# Patient Record
Sex: Male | Born: 1960 | Race: White | Hispanic: No | Marital: Married | Smoking: Never smoker
Health system: Southern US, Community
[De-identification: ages and names within clinical notes are randomized; demographics above are authoritative.]

## PROBLEM LIST (undated history)

## (undated) DIAGNOSIS — M1990 Unspecified osteoarthritis, unspecified site: Secondary | ICD-10-CM

## (undated) DIAGNOSIS — N189 Chronic kidney disease, unspecified: Secondary | ICD-10-CM

## (undated) DIAGNOSIS — J45909 Unspecified asthma, uncomplicated: Secondary | ICD-10-CM

## (undated) DIAGNOSIS — M109 Gout, unspecified: Secondary | ICD-10-CM

## (undated) DIAGNOSIS — I10 Essential (primary) hypertension: Secondary | ICD-10-CM

## (undated) DIAGNOSIS — I429 Cardiomyopathy, unspecified: Secondary | ICD-10-CM

## (undated) DIAGNOSIS — I471 Supraventricular tachycardia, unspecified: Secondary | ICD-10-CM

## (undated) HISTORY — PX: COLONOSCOPY: SHX174

## (undated) HISTORY — PX: TOOTH EXTRACTION: SUR596

---

## 2014-04-17 ENCOUNTER — Encounter (HOSPITAL_COMMUNITY): Payer: Self-pay | Admitting: Pharmacy Technician

## 2014-04-17 ENCOUNTER — Encounter (HOSPITAL_COMMUNITY)
Admission: AD | Disposition: A | Discharge status: Home Health | Payer: Self-pay | Source: Ambulatory Visit | Attending: Orthopedic Surgery

## 2014-04-17 ENCOUNTER — Inpatient Hospital Stay (HOSPITAL_COMMUNITY)
Admission: AD | Admit: 2014-04-17 | Discharge: 2014-04-18 | DRG: 488 | Disposition: A | Discharge status: Home Health | Payer: PRIVATE HEALTH INSURANCE | Source: Ambulatory Visit | Attending: Orthopedic Surgery | Admitting: Orthopedic Surgery

## 2014-04-17 ENCOUNTER — Ambulatory Visit (HOSPITAL_COMMUNITY): Payer: PRIVATE HEALTH INSURANCE

## 2014-04-17 ENCOUNTER — Encounter (HOSPITAL_COMMUNITY): Payer: PRIVATE HEALTH INSURANCE | Admitting: Anesthesiology

## 2014-04-17 ENCOUNTER — Ambulatory Visit (HOSPITAL_COMMUNITY): Payer: PRIVATE HEALTH INSURANCE | Admitting: Anesthesiology

## 2014-04-17 ENCOUNTER — Encounter (HOSPITAL_COMMUNITY): Payer: Self-pay | Admitting: *Deleted

## 2014-04-17 DIAGNOSIS — IMO0002 Reserved for concepts with insufficient information to code with codable children: Secondary | ICD-10-CM | POA: Diagnosis present

## 2014-04-17 DIAGNOSIS — Z6832 Body mass index (BMI) 32.0-32.9, adult: Secondary | ICD-10-CM

## 2014-04-17 DIAGNOSIS — M25579 Pain in unspecified ankle and joints of unspecified foot: Secondary | ICD-10-CM | POA: Diagnosis present

## 2014-04-17 DIAGNOSIS — I129 Hypertensive chronic kidney disease with stage 1 through stage 4 chronic kidney disease, or unspecified chronic kidney disease: Secondary | ICD-10-CM | POA: Diagnosis present

## 2014-04-17 DIAGNOSIS — S83242A Other tear of medial meniscus, current injury, left knee, initial encounter: Secondary | ICD-10-CM | POA: Diagnosis present

## 2014-04-17 DIAGNOSIS — N183 Chronic kidney disease, stage 3 unspecified: Secondary | ICD-10-CM

## 2014-04-17 DIAGNOSIS — E669 Obesity, unspecified: Secondary | ICD-10-CM | POA: Diagnosis present

## 2014-04-17 DIAGNOSIS — M009 Pyogenic arthritis, unspecified: Secondary | ICD-10-CM | POA: Diagnosis present

## 2014-04-17 DIAGNOSIS — M109 Gout, unspecified: Principal | ICD-10-CM | POA: Diagnosis present

## 2014-04-17 DIAGNOSIS — X58XXXA Exposure to other specified factors, initial encounter: Secondary | ICD-10-CM | POA: Diagnosis present

## 2014-04-17 DIAGNOSIS — Z79899 Other long term (current) drug therapy: Secondary | ICD-10-CM

## 2014-04-17 DIAGNOSIS — J45909 Unspecified asthma, uncomplicated: Secondary | ICD-10-CM | POA: Diagnosis present

## 2014-04-17 DIAGNOSIS — Z880 Allergy status to penicillin: Secondary | ICD-10-CM

## 2014-04-17 DIAGNOSIS — M10062 Idiopathic gout, left knee: Secondary | ICD-10-CM | POA: Diagnosis present

## 2014-04-17 DIAGNOSIS — M9420 Chondromalacia, unspecified site: Secondary | ICD-10-CM | POA: Diagnosis present

## 2014-04-17 HISTORY — DX: Chronic kidney disease, unspecified: N18.9

## 2014-04-17 HISTORY — DX: Unspecified osteoarthritis, unspecified site: M19.90

## 2014-04-17 HISTORY — PX: KNEE ARTHROSCOPY WITH MEDIAL MENISECTOMY: SHX5651

## 2014-04-17 HISTORY — DX: Unspecified asthma, uncomplicated: J45.909

## 2014-04-17 HISTORY — PX: ASPIRATION BIOPSY: SHX1197

## 2014-04-17 HISTORY — PX: KNEE ARTHROSCOPY: SHX127

## 2014-04-17 HISTORY — DX: Essential (primary) hypertension: I10

## 2014-04-17 HISTORY — DX: Gout, unspecified: M10.9

## 2014-04-17 LAB — COMPREHENSIVE METABOLIC PANEL
ALT: 19 U/L (ref 0–53)
AST: 20 U/L (ref 0–37)
Albumin: 4 g/dL (ref 3.5–5.2)
Alkaline Phosphatase: 81 U/L (ref 39–117)
BUN: 23 mg/dL (ref 6–23)
CO2: 20 mEq/L (ref 19–32)
Calcium: 10.1 mg/dL (ref 8.4–10.5)
Chloride: 100 mEq/L (ref 96–112)
Creatinine, Ser: 1.29 mg/dL (ref 0.50–1.35)
GFR calc Af Amer: 72 mL/min — ABNORMAL LOW (ref >90)
GFR calc non Af Amer: 62 mL/min — ABNORMAL LOW (ref >90)
Glucose, Bld: 123 mg/dL — ABNORMAL HIGH (ref 70–99)
Potassium: 4.7 mEq/L (ref 3.7–5.3)
Sodium: 137 mEq/L (ref 137–147)
Total Bilirubin: 0.7 mg/dL (ref 0.3–1.2)
Total Protein: 8.3 g/dL (ref 6.0–8.3)

## 2014-04-17 LAB — CBC
HCT: 43.1 % (ref 39.0–52.0)
Hemoglobin: 14.8 g/dL (ref 13.0–17.0)
MCH: 29.4 pg (ref 26.0–34.0)
MCHC: 34.3 g/dL (ref 30.0–36.0)
MCV: 85.7 fL (ref 78.0–100.0)
Platelets: 245 10*3/uL (ref 150–400)
RBC: 5.03 MIL/uL (ref 4.22–5.81)
RDW: 14.1 % (ref 11.5–15.5)
WBC: 14.9 10*3/uL — ABNORMAL HIGH (ref 4.0–10.5)

## 2014-04-17 LAB — SYNOVIAL CELL COUNT + DIFF, W/ CRYSTALS
Eosinophils-Synovial: 0 % (ref 0–1)
Lymphocytes-Synovial Fld: 1 % (ref 0–20)
Monocyte-Macrophage-Synovial Fluid: 28 % — ABNORMAL LOW (ref 50–90)
Neutrophil, Synovial: 71 % — ABNORMAL HIGH (ref 0–25)
Other Cells-SYN: 0
WBC, Synovial: 61000 /mm3 — ABNORMAL HIGH (ref 0–200)

## 2014-04-17 LAB — GRAM STAIN

## 2014-04-17 LAB — URINALYSIS, ROUTINE W REFLEX MICROSCOPIC
Bilirubin Urine: NEGATIVE
Glucose, UA: NEGATIVE mg/dL
Hgb urine dipstick: NEGATIVE
Ketones, ur: NEGATIVE mg/dL
Leukocytes, UA: NEGATIVE
Nitrite: NEGATIVE
Protein, ur: NEGATIVE mg/dL
Specific Gravity, Urine: 1.024 (ref 1.005–1.030)
Urobilinogen, UA: 0.2 mg/dL (ref 0.0–1.0)
pH: 6 (ref 5.0–8.0)

## 2014-04-17 LAB — C-REACTIVE PROTEIN: CRP: 12.5 mg/dL — ABNORMAL HIGH (ref <0.60)

## 2014-04-17 LAB — PROTIME-INR
INR: 1.05 (ref 0.00–1.49)
Prothrombin Time: 13.5 seconds (ref 11.6–15.2)

## 2014-04-17 LAB — SEDIMENTATION RATE: Sed Rate: 24 mm/hr — ABNORMAL HIGH (ref 0–16)

## 2014-04-17 LAB — APTT: aPTT: 32 seconds (ref 24–37)

## 2014-04-17 SURGERY — ARTHROSCOPY, KNEE
Anesthesia: General | Site: Knee | Laterality: Left

## 2014-04-17 MED ORDER — HYDROMORPHONE HCL PF 1 MG/ML IJ SOLN: 1.0000 mg | INTRAMUSCULAR | Status: DC | PRN

## 2014-04-17 MED ORDER — VANCOMYCIN HCL IN DEXTROSE 1-5 GM/200ML-% IV SOLN
1000.0000 mg | Freq: Two times a day (BID) | INTRAVENOUS | Status: DC
  Administered 2014-04-17 – 2014-04-18 (×2): 1000 mg via INTRAVENOUS
  Filled 2014-04-17 (×4): qty 200

## 2014-04-17 MED ORDER — ROCURONIUM BROMIDE 50 MG/5ML IV SOLN
INTRAVENOUS | Status: AC
  Filled 2014-04-17: qty 1

## 2014-04-17 MED ORDER — LIDOCAINE HCL (CARDIAC) 20 MG/ML IV SOLN
INTRAVENOUS | Status: AC
  Filled 2014-04-17: qty 10

## 2014-04-17 MED ORDER — ASPIRIN EC 325 MG PO TBEC
325.0000 mg | DELAYED_RELEASE_TABLET | Freq: Every day | ORAL | Status: DC
  Administered 2014-04-18: 325 mg via ORAL
  Filled 2014-04-17: qty 1

## 2014-04-17 MED ORDER — PROPOFOL 10 MG/ML IV BOLUS
INTRAVENOUS | Status: AC
  Filled 2014-04-17: qty 20

## 2014-04-17 MED ORDER — VANCOMYCIN HCL IN DEXTROSE 1-5 GM/200ML-% IV SOLN
INTRAVENOUS | Status: AC
  Administered 2014-04-17: 1000 mg via INTRAVENOUS
  Filled 2014-04-17: qty 200

## 2014-04-17 MED ORDER — SODIUM CHLORIDE 0.9 % IV SOLN: INTRAVENOUS | Status: DC

## 2014-04-17 MED ORDER — OXYCODONE HCL 5 MG/5ML PO SOLN: 5.0000 mg | Freq: Once | ORAL | Status: AC | PRN

## 2014-04-17 MED ORDER — METHOCARBAMOL 1000 MG/10ML IJ SOLN
500.0000 mg | Freq: Four times a day (QID) | INTRAMUSCULAR | Status: DC | PRN
  Filled 2014-04-17: qty 5

## 2014-04-17 MED ORDER — ONDANSETRON HCL 4 MG/2ML IJ SOLN
INTRAMUSCULAR | Status: AC
  Filled 2014-04-17: qty 2

## 2014-04-17 MED ORDER — LIDOCAINE HCL (CARDIAC) 20 MG/ML IV SOLN
INTRAVENOUS | Status: DC | PRN
  Administered 2014-04-17: 100 mg via INTRAVENOUS

## 2014-04-17 MED ORDER — PROPOFOL 10 MG/ML IV BOLUS
INTRAVENOUS | Status: DC | PRN
  Administered 2014-04-17: 200 mg via INTRAVENOUS

## 2014-04-17 MED ORDER — MOMETASONE FURO-FORMOTEROL FUM 100-5 MCG/ACT IN AERO
2.0000 | INHALATION_SPRAY | Freq: Two times a day (BID) | RESPIRATORY_TRACT | Status: DC
  Filled 2014-04-17: qty 8.8

## 2014-04-17 MED ORDER — BENAZEPRIL HCL 10 MG PO TABS
10.0000 mg | ORAL_TABLET | Freq: Every day | ORAL | Status: DC
  Administered 2014-04-17 – 2014-04-18 (×2): 10 mg via ORAL
  Filled 2014-04-17 (×4): qty 1

## 2014-04-17 MED ORDER — MIDAZOLAM HCL 5 MG/5ML IJ SOLN
INTRAMUSCULAR | Status: DC | PRN
  Administered 2014-04-17: 2 mg via INTRAVENOUS

## 2014-04-17 MED ORDER — FENTANYL CITRATE 0.05 MG/ML IJ SOLN
INTRAMUSCULAR | Status: DC | PRN
  Administered 2014-04-17: 25 ug via INTRAVENOUS
  Administered 2014-04-17: 50 ug via INTRAVENOUS
  Administered 2014-04-17: 25 ug via INTRAVENOUS
  Administered 2014-04-17 (×3): 50 ug via INTRAVENOUS

## 2014-04-17 MED ORDER — OXYCODONE HCL 5 MG PO TABS
ORAL_TABLET | ORAL | Status: AC
  Filled 2014-04-17: qty 1

## 2014-04-17 MED ORDER — ONDANSETRON HCL 4 MG/2ML IJ SOLN
INTRAMUSCULAR | Status: DC | PRN
  Administered 2014-04-17: 4 mg via INTRAVENOUS

## 2014-04-17 MED ORDER — MEPERIDINE HCL 25 MG/ML IJ SOLN: 6.2500 mg | INTRAMUSCULAR | Status: DC | PRN

## 2014-04-17 MED ORDER — SODIUM CHLORIDE 0.9 % IR SOLN
Status: DC | PRN
  Administered 2014-04-17: 3000 mL

## 2014-04-17 MED ORDER — HYDROMORPHONE HCL PF 1 MG/ML IJ SOLN
INTRAMUSCULAR | Status: AC
  Filled 2014-04-17: qty 1

## 2014-04-17 MED ORDER — MONTELUKAST SODIUM 10 MG PO TABS
10.0000 mg | ORAL_TABLET | Freq: Every day | ORAL | Status: DC
  Administered 2014-04-17: 10 mg via ORAL
  Filled 2014-04-17 (×2): qty 1

## 2014-04-17 MED ORDER — HYDROMORPHONE HCL PF 1 MG/ML IJ SOLN
0.2500 mg | INTRAMUSCULAR | Status: DC | PRN
  Administered 2014-04-17 (×2): 0.5 mg via INTRAVENOUS

## 2014-04-17 MED ORDER — SODIUM CHLORIDE 0.9 % IV SOLN
INTRAVENOUS | Status: DC
  Administered 2014-04-17: 19:00:00 via INTRAVENOUS

## 2014-04-17 MED ORDER — MIDAZOLAM HCL 2 MG/2ML IJ SOLN
INTRAMUSCULAR | Status: AC
  Filled 2014-04-17: qty 2

## 2014-04-17 MED ORDER — BUPIVACAINE HCL (PF) 0.25 % IJ SOLN
INTRAMUSCULAR | Status: AC
  Filled 2014-04-17: qty 10

## 2014-04-17 MED ORDER — BUPIVACAINE HCL (PF) 0.25 % IJ SOLN
INTRAMUSCULAR | Status: DC | PRN
  Administered 2014-04-17: 10 mL

## 2014-04-17 MED ORDER — DEXTROSE 5 % IV SOLN
1.0000 g | INTRAVENOUS | Status: DC
  Administered 2014-04-17: 1 g via INTRAVENOUS
  Filled 2014-04-17 (×2): qty 10

## 2014-04-17 MED ORDER — NEOSTIGMINE METHYLSULFATE 10 MG/10ML IV SOLN
INTRAVENOUS | Status: AC
  Filled 2014-04-17: qty 1

## 2014-04-17 MED ORDER — FENTANYL CITRATE 0.05 MG/ML IJ SOLN
INTRAMUSCULAR | Status: AC
  Filled 2014-04-17: qty 5

## 2014-04-17 MED ORDER — LACTATED RINGERS IV SOLN
INTRAVENOUS | Status: DC
  Administered 2014-04-17 (×2): via INTRAVENOUS

## 2014-04-17 MED ORDER — GLYCOPYRROLATE 0.2 MG/ML IJ SOLN
INTRAMUSCULAR | Status: AC
  Filled 2014-04-17: qty 2

## 2014-04-17 MED ORDER — PROMETHAZINE HCL 25 MG/ML IJ SOLN: 6.2500 mg | INTRAMUSCULAR | Status: DC | PRN

## 2014-04-17 MED ORDER — ALLOPURINOL 300 MG PO TABS
300.0000 mg | ORAL_TABLET | Freq: Every day | ORAL | Status: DC
  Administered 2014-04-17 – 2014-04-18 (×2): 300 mg via ORAL
  Filled 2014-04-17 (×2): qty 1

## 2014-04-17 MED ORDER — ONDANSETRON HCL 4 MG/2ML IJ SOLN: 4.0000 mg | Freq: Four times a day (QID) | INTRAMUSCULAR | Status: DC | PRN

## 2014-04-17 MED ORDER — HYDROCODONE-ACETAMINOPHEN 10-325 MG PO TABS
1.0000 | ORAL_TABLET | Freq: Four times a day (QID) | ORAL | Status: DC | PRN
  Administered 2014-04-17: 2 via ORAL
  Filled 2014-04-17: qty 2

## 2014-04-17 MED ORDER — ONDANSETRON HCL 4 MG PO TABS: 4.0000 mg | ORAL_TABLET | Freq: Four times a day (QID) | ORAL | Status: DC | PRN

## 2014-04-17 MED ORDER — LABETALOL HCL 5 MG/ML IV SOLN
INTRAVENOUS | Status: DC | PRN
  Administered 2014-04-17: 5 mg via INTRAVENOUS
  Administered 2014-04-17: 10 mg via INTRAVENOUS
  Administered 2014-04-17: 5 mg via INTRAVENOUS

## 2014-04-17 MED ORDER — METHOCARBAMOL 500 MG PO TABS
500.0000 mg | ORAL_TABLET | Freq: Four times a day (QID) | ORAL | Status: DC | PRN
  Administered 2014-04-17: 500 mg via ORAL
  Filled 2014-04-17: qty 1

## 2014-04-17 MED ORDER — OXYCODONE HCL 5 MG PO TABS
5.0000 mg | ORAL_TABLET | Freq: Once | ORAL | Status: AC | PRN
  Administered 2014-04-17: 5 mg via ORAL

## 2014-04-17 SURGICAL SUPPLY — 39 items
BANDAGE ELASTIC 4 VELCRO ST LF (GAUZE/BANDAGES/DRESSINGS) ×1 IMPLANT
BANDAGE ELASTIC 6 VELCRO ST LF (GAUZE/BANDAGES/DRESSINGS) ×2 IMPLANT
BANDAGE GAUZE ELAST BULKY 4 IN (GAUZE/BANDAGES/DRESSINGS) ×2 IMPLANT
BLADE GREAT WHITE 4.2 (BLADE) ×2 IMPLANT
CONT SPECI 4OZ STER CLIK (MISCELLANEOUS) ×1 IMPLANT
DRAPE ARTHROSCOPY W/POUCH 114 (DRAPES) ×2 IMPLANT
DRSG EMULSION OIL 3X3 NADH (GAUZE/BANDAGES/DRESSINGS) ×2 IMPLANT
DRSG PAD ABDOMINAL 8X10 ST (GAUZE/BANDAGES/DRESSINGS) ×2 IMPLANT
DURAPREP 26ML APPLICATOR (WOUND CARE) ×2 IMPLANT
EVACUATOR 1/8 PVC DRAIN (DRAIN) ×1 IMPLANT
GLOVE BIOGEL PI IND STRL 7.0 (GLOVE) IMPLANT
GLOVE BIOGEL PI IND STRL 8 (GLOVE) ×2 IMPLANT
GLOVE BIOGEL PI INDICATOR 7.0 (GLOVE) ×2
GLOVE BIOGEL PI INDICATOR 8 (GLOVE) ×2
GLOVE ECLIPSE 7.5 STRL STRAW (GLOVE) ×4 IMPLANT
GOWN STRL REUS W/ TWL LRG LVL3 (GOWN DISPOSABLE) ×1 IMPLANT
GOWN STRL REUS W/ TWL XL LVL3 (GOWN DISPOSABLE) ×2 IMPLANT
GOWN STRL REUS W/TWL LRG LVL3 (GOWN DISPOSABLE) ×2
GOWN STRL REUS W/TWL XL LVL3 (GOWN DISPOSABLE) ×4
KIT BASIN OR (CUSTOM PROCEDURE TRAY) ×2 IMPLANT
KIT ROOM TURNOVER OR (KITS) ×2 IMPLANT
NDL 18GX1X1/2 (RX/OR ONLY) (NEEDLE) ×1 IMPLANT
NEEDLE 18GX1X1/2 (RX/OR ONLY) (NEEDLE) ×2 IMPLANT
PACK ARTHROSCOPY DSU (CUSTOM PROCEDURE TRAY) ×2 IMPLANT
PAD ABD 8X10 STRL (GAUZE/BANDAGES/DRESSINGS) ×1 IMPLANT
PAD ARMBOARD 7.5X6 YLW CONV (MISCELLANEOUS) ×4 IMPLANT
PAD CAST 4YDX4 CTTN HI CHSV (CAST SUPPLIES) ×2 IMPLANT
PADDING CAST ABS 4INX4YD NS (CAST SUPPLIES) ×1
PADDING CAST ABS 6INX4YD NS (CAST SUPPLIES) ×1
PADDING CAST ABS COTTON 4X4 ST (CAST SUPPLIES) IMPLANT
PADDING CAST ABS COTTON 6X4 NS (CAST SUPPLIES) IMPLANT
PADDING CAST COTTON 4X4 STRL (CAST SUPPLIES) ×4
SET ARTHROSCOPY TUBING (MISCELLANEOUS) ×2
SET ARTHROSCOPY TUBING LN (MISCELLANEOUS) ×1 IMPLANT
SPONGE GAUZE 4X4 12PLY (GAUZE/BANDAGES/DRESSINGS) ×2 IMPLANT
SPONGE GAUZE 4X4 12PLY STER LF (GAUZE/BANDAGES/DRESSINGS) ×1 IMPLANT
TOWEL OR 17X24 6PK STRL BLUE (TOWEL DISPOSABLE) ×2 IMPLANT
TOWEL OR 17X26 10 PK STRL BLUE (TOWEL DISPOSABLE) ×2 IMPLANT
WRAP KNEE MAXI GEL POST OP (GAUZE/BANDAGES/DRESSINGS) ×1 IMPLANT

## 2014-04-17 NOTE — Consult Note (Addendum)
[facility] for Infectious Disease  Date of Admission:  04/17/2014  Date of Consult:  04/17/2014  Reason for Consult: Septic Arthritis L Knee  Referring Physician: Graves  Impression/Recommendation Septic Arthritis L Knee  Would Add ceftriaxone Watch kidney function closely Await Cx Check HIV per CDC guidelines.  Will most likely need PIC, long term anbx but will wait more cx data.... His PEN allergy is unclear, childhood.   Thank you so much for this interesting consult,   Campbell Riches (pager) [phone] [email or web address]  Theodore White is an 53 y.o. male.  HPI: 53 yo M with hx of CKD stage 3 (due to indocin use, age, weight, HTN per pt), and gout, was eval by Dr Berenice Primas for L knee swelling for 2 days on 5-27. He was found to have synovial fluid with 107k WBC, 97%N. No crystals.  He was taken to OR today and underwent wash out.  No hx of trauma or recent procedures or dental work.  Has had subj fevers and sweats last 24h.  No proximal or distal erythema.   Past Medical History  Diagnosis Date  . Hypertension   . Asthma   . Chronic kidney disease     stage 3 renal failure under control    Past Surgical History  Procedure Laterality Date  . Colonoscopy    . Tooth extraction       Allergies  Allergen Reactions  . Penicillins Other (See Comments)    Had med as a child doesn't know the reaction    Medications:  Scheduled: . HYDROmorphone      . oxyCODONE        Abtx:  Anti-infectives   Start     Dose/Rate Route Frequency Ordered Stop   04/17/14 1556  vancomycin (VANCOCIN) 1 GM/200ML IVPB    Comments:  Mumm, Valerie   : cabinet override      04/17/14 1556 04/17/14 1635      Total days of antibiotics 0 (vanco)          Social History:  reports that he has never smoked. He does not have any smokeless tobacco history on file. He reports that he does not drink alcohol or use illicit drugs.  History reviewed. No pertinent family history  (mother cholecystectomy, father valve replacement).  General ROS: see HPI. normal BM, normal urine. no sick exposures. last gout exacerbation 2 weeks ago, R ankle.   Blood pressure 174/98, pulse 80, temperature 98.4 F (36.9 C), temperature source Oral, resp. rate 14, height 6' 1"  (1.854 m), weight 110.281 kg (243 lb 2 oz), SpO2 100.00%. General appearance: alert, cooperative and no distress Eyes: negative findings: conjunctivae and sclerae normal and pupils equal, round, reactive to light and accomodation Throat: lips, mucosa, and tongue normal; teeth and gums normal Neck: no adenopathy and supple, symmetrical, trachea midline Lungs: clear to auscultation bilaterally Heart: regular rate and rhythm Abdomen: normal findings: bowel sounds normal and soft, non-tender Extremities: edema none and L knee wrapped. no distal erythema Neurologic: Sensory: grossly normal in LE   Results for orders placed during the hospital encounter of 04/17/14 (from the past 48 hour(s))  URINALYSIS, ROUTINE W REFLEX MICROSCOPIC     Status: None   Collection Time    04/17/14 12:47 PM      Result Value Ref Range   Color, Urine YELLOW  YELLOW   APPearance CLEAR  CLEAR   Specific Gravity, Urine 1.024  1.005 - 1.030   pH 6.0  5.0 - 8.0   Glucose, UA NEGATIVE  NEGATIVE mg/dL   Hgb urine dipstick NEGATIVE  NEGATIVE   Bilirubin Urine NEGATIVE  NEGATIVE   Ketones, ur NEGATIVE  NEGATIVE mg/dL   Protein, ur NEGATIVE  NEGATIVE mg/dL   Urobilinogen, UA 0.2  0.0 - 1.0 mg/dL   Nitrite NEGATIVE  NEGATIVE   Leukocytes, UA NEGATIVE  NEGATIVE   Comment: MICROSCOPIC NOT DONE ON URINES WITH NEGATIVE PROTEIN, BLOOD, LEUKOCYTES, NITRITE, OR GLUCOSE <1000 mg/dL.  SEDIMENTATION RATE     Status: Abnormal   Collection Time    04/17/14  1:08 PM      Result Value Ref Range   Sed Rate 24 (*) 0 - 16 mm/hr  C-REACTIVE PROTEIN     Status: Abnormal   Collection Time    04/17/14  1:08 PM      Result Value Ref Range   CRP 12.5 (*)  <0.60 mg/dL   Comment: Performed at [facility]  CBC     Status: Abnormal   Collection Time    04/17/14  1:08 PM      Result Value Ref Range   WBC 14.9 (*) 4.0 - 10.5 K/uL   RBC 5.03  4.22 - 5.81 MIL/uL   Hemoglobin 14.8  13.0 - 17.0 g/dL   HCT 43.1  39.0 - 52.0 %   MCV 85.7  78.0 - 100.0 fL   MCH 29.4  26.0 - 34.0 pg   MCHC 34.3  30.0 - 36.0 g/dL   RDW 14.1  11.5 - 15.5 %   Platelets 245  150 - 400 K/uL  COMPREHENSIVE METABOLIC PANEL     Status: Abnormal   Collection Time    04/17/14  1:08 PM      Result Value Ref Range   Sodium 137  137 - 147 mEq/L   Potassium 4.7  3.7 - 5.3 mEq/L   Chloride 100  96 - 112 mEq/L   CO2 20  19 - 32 mEq/L   Glucose, Bld 123 (*) 70 - 99 mg/dL   BUN 23  6 - 23 mg/dL   Creatinine, Ser 1.29  0.50 - 1.35 mg/dL   Calcium 10.1  8.4 - 10.5 mg/dL   Total Protein 8.3  6.0 - 8.3 g/dL   Albumin 4.0  3.5 - 5.2 g/dL   AST 20  0 - 37 U/L   ALT 19  0 - 53 U/L   Alkaline Phosphatase 81  39 - 117 U/L   Total Bilirubin 0.7  0.3 - 1.2 mg/dL   GFR calc non Af Amer 62 (*) >90 mL/min   GFR calc Af Amer 72 (*) >90 mL/min   Comment: (NOTE)     The eGFR has been calculated using the CKD EPI equation.     This calculation has not been validated in all clinical situations.     eGFR's persistently <90 mL/min signify possible Chronic Kidney     Disease.  PROTIME-INR     Status: None   Collection Time    04/17/14  1:08 PM      Result Value Ref Range   Prothrombin Time 13.5  11.6 - 15.2 seconds   INR 1.05  0.00 - 1.49  APTT     Status: None   Collection Time    04/17/14  1:08 PM      Result Value Ref Range   aPTT 32  24 - 37 seconds  GRAM STAIN     Status: None  Collection Time    04/17/14  4:01 PM      Result Value Ref Range   Specimen Description SYNOVIAL FLUID LEFT KNEE     Special Requests 20ML FLUID     Gram Stain       Value: ABUNDANT WBC PRESENT,BOTH PMN AND MONONUCLEAR     NO ORGANISMS SEEN   Report Status 04/17/2014 FINAL          Component Value Date/Time   SDES SYNOVIAL FLUID LEFT KNEE 04/17/2014 1601   SPECREQUEST 20ML FLUID 04/17/2014 1601   REPTSTATUS 04/17/2014 FINAL 04/17/2014 1601    Chest 2 View  04/17/2014   CLINICAL DATA:  Preoperative evaluation for knee arthroscopy. History of hypertension and asthma.  EXAM: CHEST  2 VIEW  COMPARISON:  None.  FINDINGS: The heart size and mediastinal contours are normal. There is mild diffuse interstitial prominence throughout the lungs which appears chronic. There is no overt pulmonary edema or confluent airspace opacity. There is no pleural effusion or pneumothorax. The osseous structures appear normal.  IMPRESSION: Mild chronic appearing interstitial prominence. No acute cardiopulmonary process.   Electronically Signed   By: Camie Patience M.D.   On: 04/17/2014 13:19   Recent Results (from the past 240 hour(s))  GRAM STAIN     Status: None   Collection Time    04/17/14  4:01 PM      Result Value Ref Range Status   Specimen Description SYNOVIAL FLUID LEFT KNEE   Final   Special Requests 20ML FLUID   Final   Gram Stain     Final   Value: ABUNDANT WBC PRESENT,BOTH PMN AND MONONUCLEAR     NO ORGANISMS SEEN   Report Status 04/17/2014 FINAL   Final      04/17/2014, 6:16 PM     LOS: 0 days     **Disclaimer: This note may have been dictated with voice recognition software. Similar sounding words can inadvertently be transcribed and this note may contain transcription errors which may not have been corrected upon publication of note.**

## 2014-04-17 NOTE — Transfer of Care (Signed)
Immediate Anesthesia Transfer of Care Note  Patient: Theodore White  Procedure(s) Performed: Procedure(s): ARTHROSCOPY KNEE with medial menisectomy, chondralplasty of patella-femoral joint, extensive synovectomy, aspiration of right ankle (Left)  Patient Location: PACU  Anesthesia Type:General  Level of Consciousness: awake, alert , oriented and patient cooperative  Airway & Oxygen Therapy: Patient Spontanous Breathing and Patient connected to nasal cannula oxygen  Post-op Assessment: Report given to PACU RN, Post -op Vital signs reviewed and stable and Patient moving all extremities  Post vital signs: Reviewed and stable  Complications: No apparent anesthesia complications

## 2014-04-17 NOTE — Anesthesia Postprocedure Evaluation (Signed)
Anesthesia Post Note  Patient: Theodore White  Procedure(s) Performed: Procedure(s) (LRB): ARTHROSCOPY KNEE with medial menisectomy, chondralplasty of patella-femoral joint, extensive synovectomy, aspiration of right ankle (Left)  Anesthesia type: General  Patient location: PACU  Post pain: Pain level controlled  Post assessment: Post-op Vital signs reviewed  Last Vitals: BP 174/98  Pulse 80  Temp(Src) 36.9 C (Oral)  Resp 14  Ht 6\' 1"  (1.854 m)  Wt 243 lb 2 oz (110.281 kg)  BMI 32.08 kg/m2  SpO2 100%  Post vital signs: Reviewed  Level of consciousness: sedated  Complications: No apparent anesthesia complications

## 2014-04-17 NOTE — Progress Notes (Signed)
04/17/14 1312  OBSTRUCTIVE SLEEP APNEA  Have you ever been diagnosed with sleep apnea through a sleep study? No  Do you snore loudly (loud enough to be heard through closed doors)?  0  Do you often feel tired, fatigued, or sleepy during the daytime? 0  Has anyone observed you stop breathing during your sleep? 1  Do you have, or are you being treated for high blood pressure? 1  BMI more than 35 kg/m2? 0  Age over 52 years old? 1  Neck circumference greater than 40 cm/16 inches? 1  Gender: 1  Obstructive Sleep Apnea Score 5  Score 4 or greater  Results sent to PCP

## 2014-04-17 NOTE — Anesthesia Preprocedure Evaluation (Addendum)
Anesthesia Evaluation  Patient identified by MRN, date of birth, ID band Patient awake    Reviewed: Allergy & Precautions, H&P , NPO status , Patient's Chart, lab work & pertinent test results  Airway Mallampati: II TM Distance: >3 FB     Dental  (+) Teeth Intact, Dental Advisory Given   Pulmonary asthma ,  breath sounds clear to auscultation        Cardiovascular hypertension, Pt. on medications Rhythm:Regular     Neuro/Psych negative neurological ROS  negative psych ROS   GI/Hepatic negative GI ROS, Neg liver ROS,   Endo/Other  negative endocrine ROS  Renal/GU CRF and Renal InsufficiencyRenal diseasenegative Renal ROS     Musculoskeletal negative musculoskeletal ROS (+)   Abdominal (+) + obese,   Peds  Hematology negative hematology ROS (+)   Anesthesia Other Findings   Reproductive/Obstetrics negative OB ROS                          Anesthesia Physical Anesthesia Plan  ASA: III  Anesthesia Plan: General   Post-op Pain Management:    Induction: Intravenous  Airway Management Planned: LMA  Additional Equipment:   Intra-op Plan:   Post-operative Plan: Extubation in OR  Informed Consent: I have reviewed the patients History and Physical, chart, labs and discussed the procedure including the risks, benefits and alternatives for the proposed anesthesia with the patient or authorized representative who has indicated his/her understanding and acceptance.   Dental advisory given  Plan Discussed with: CRNA  Anesthesia Plan Comments:         Anesthesia Quick Evaluation

## 2014-04-17 NOTE — H&P (Signed)
PREOPERATIVE H&P  Chief Complaint: l knee pain  HPI: Theodore White is a 53 y.o. male who presents for evaluation of l knee pain and swelling. It has been present for several days and has been worsening. Pt presented to my office with 2 day history of feeling poorly and having l knee swelling and pain.  He had had no trauma to knee.  He has a history of gout.  We aspirated his knee and got cloudy fluid out.  With the history of gout, we injected 2cc betamethasone after aspirating 80 cc cloudy fluid.  Fluid produced 107k wbc with 97% neutrophils and no gout crystals. Gram stain neg and culture pending, but with the history and high number of WBC's we felt that wash out and debridement necesssary and he comes in for that.He has failed conservative measures. Pain is rated as moderate.  Past Medical History  Diagnosis Date  . Hypertension   . Asthma   . Chronic kidney disease     stage 3 renal failure under control   Past Surgical History  Procedure Laterality Date  . Colonoscopy    . Tooth extraction     History   Social History  . Marital Status: Married    Spouse Name: N/A    Number of Children: N/A  . Years of Education: N/A   Social History Main Topics  . Smoking status: Never Smoker   . Smokeless tobacco: None  . Alcohol Use: No  . Drug Use: No  . Sexual Activity: None   Other Topics Concern  . None   Social History Narrative  . None   History reviewed. No pertinent family history. Allergies  Allergen Reactions  . Penicillins Other (See Comments)    Had med as a child doesn't know the reaction   Prior to Admission medications   Not on File     Positive ROS: none  All other systems have been reviewed and were otherwise negative with the exception of those mentioned in the HPI and as above.  Physical Exam: Filed Vitals:   04/17/14 1347  BP: 174/99  Pulse:   Temp:   Resp:     General: Alert, no acute distress Cardiovascular: No pedal edema Respiratory:  No cyanosis, no use of accessory musculature GI: No organomegaly, abdomen is soft and non-tender Skin: No lesions in the area of chief complaint Neurologic: Sensation intact distally Psychiatric: Patient is competent for consent with normal mood and affect Lymphatic: No axillary or cervical lymphadenopathy  MUSCULOSKELETAL: l. Knee: painful rom and significant soft tissur swelling  Assessment/Plan: Infected  Left Knee Plan for Procedure(s): ARTHROSCOPY KNEE with I@D  and debridement arthroscopic  The risks benefits and alternatives were discussed with the patient including but not limited to the risks of nonoperative treatment, versus surgical intervention including infection, bleeding, nerve injury, malunion, nonunion, hardware prominence, hardware failure, need for hardware removal, blood clots, cardiopulmonary complications, morbidity, mortality, among others, and they were willing to proceed.  Predicted outcome is good, although there will be at least a six to nine month expected recovery.  Harvie Junior, MD 04/17/2014 3:25 PM

## 2014-04-17 NOTE — Brief Op Note (Signed)
04/17/2014  5:08 PM  PATIENT:  Theodore White  53 y.o. male  PRE-OPERATIVE DIAGNOSIS:  Infected  Left Knee  POST-OPERATIVE DIAGNOSIS:  Infected  Left Knee  PROCEDURE:  Procedure(s): ARTHROSCOPY KNEE with medial menisectomy, chondralplasty of patella-femoral joint, extensive synovectomy, aspiration of right ankle (Left)  SURGEON:  Surgeon(s) and Role:    * Harvie Junior, MD - Primary  PHYSICIAN ASSISTANT:   ASSISTANTS: bethune   ANESTHESIA:   general  EBL:  Total I/O In: 1000 [I.V.:1000] Out: -   BLOOD ADMINISTERED:none  DRAINS: (1) Hemovact drain(s) in the lknee with  Suction Open   LOCAL MEDICATIONS USED:  MARCAINE     SPECIMEN:  Source of Specimen:  l knee fluid  DISPOSITION OF SPECIMEN:  to lab for studies  COUNTS:  YES  TOURNIQUET:  * No tourniquets in log *  DICTATION: .Other Dictation: Dictation Number [id number]  PLAN OF CARE: Admit to inpatient   PATIENT DISPOSITION:  PACU - hemodynamically stable.   Delay start of Pharmacological VTE agent (>24hrs) due to surgical blood loss or risk of bleeding: no

## 2014-04-17 NOTE — Progress Notes (Signed)
Spoke with Dr. Ilsa Iha from ID who will see patient later today or tomorrow.  Will start Vanc after intraoperative cultures today per Dr. Ilsa Iha.

## 2014-04-18 ENCOUNTER — Encounter (HOSPITAL_COMMUNITY): Payer: Self-pay | Admitting: General Practice

## 2014-04-18 DIAGNOSIS — S83242A Other tear of medial meniscus, current injury, left knee, initial encounter: Secondary | ICD-10-CM | POA: Diagnosis present

## 2014-04-18 DIAGNOSIS — M10062 Idiopathic gout, left knee: Secondary | ICD-10-CM | POA: Diagnosis present

## 2014-04-18 LAB — SEDIMENTATION RATE: Sed Rate: 21 mm/hr — ABNORMAL HIGH (ref 0–16)

## 2014-04-18 LAB — HIV ANTIBODY (ROUTINE TESTING W REFLEX): HIV 1&2 Ab, 4th Generation: NONREACTIVE

## 2014-04-18 LAB — CBC WITH DIFFERENTIAL/PLATELET
Basophils Absolute: 0 10*3/uL (ref 0.0–0.1)
Basophils Relative: 0 % (ref 0–1)
Eosinophils Absolute: 0 10*3/uL (ref 0.0–0.7)
Eosinophils Relative: 0 % (ref 0–5)
HCT: 37.6 % — ABNORMAL LOW (ref 39.0–52.0)
Hemoglobin: 12.5 g/dL — ABNORMAL LOW (ref 13.0–17.0)
Lymphocytes Relative: 14 % (ref 12–46)
Lymphs Abs: 1.9 10*3/uL (ref 0.7–4.0)
MCH: 29 pg (ref 26.0–34.0)
MCHC: 33.2 g/dL (ref 30.0–36.0)
MCV: 87.2 fL (ref 78.0–100.0)
Monocytes Absolute: 1.4 10*3/uL — ABNORMAL HIGH (ref 0.1–1.0)
Monocytes Relative: 10 % (ref 3–12)
Neutro Abs: 11 10*3/uL — ABNORMAL HIGH (ref 1.7–7.7)
Neutrophils Relative %: 76 % (ref 43–77)
Platelets: 218 10*3/uL (ref 150–400)
RBC: 4.31 MIL/uL (ref 4.22–5.81)
RDW: 14.6 % (ref 11.5–15.5)
WBC: 14.4 10*3/uL — ABNORMAL HIGH (ref 4.0–10.5)

## 2014-04-18 LAB — C-REACTIVE PROTEIN: CRP: 5.7 mg/dL — ABNORMAL HIGH (ref <0.60)

## 2014-04-18 MED ORDER — HYDROCODONE-ACETAMINOPHEN 5-325 MG PO TABS: 1.0000 | ORAL_TABLET | Freq: Four times a day (QID) | ORAL | Status: DC | PRN

## 2014-04-18 NOTE — Progress Notes (Signed)
Orthopedic Tech Progress Note Patient Details:  Theodore White 07/05/1961 [id number] Crutches delivered and fitted for height and comfort Ortho Devices Type of Ortho Device: Crutches Ortho Device/Splint Interventions: Ordered   [name] 04/18/2014, 8:34 AM

## 2014-04-18 NOTE — Progress Notes (Signed)
Subjective: 1 Day Post-Op Procedure(s) (LRB): ARTHROSCOPY KNEE with medial menisectomy, chondralplasty of patella-femoral joint, extensive synovectomy, aspiration of right ankle (Left) Patient reports pain as mild.    Objective: Vital signs in last 24 hours: Temp:  [97.7 F (36.5 C)-98.9 F (37.2 C)] 98.6 F (37 C) (05/28 0410) Pulse Rate:  [75-85] 77 (05/28 0410) Resp:  [13-20] 18 (05/28 0410) BP: (127-174)/(70-99) 136/81 mmHg (05/28 0410) SpO2:  [93 %-100 %] 94 % (05/28 0410) Weight:  [243 lb 2 oz (110.281 kg)] 243 lb 2 oz (110.281 kg) (05/27 1305)  Intake/Output from previous day: 05/27 0701 - 05/28 0700 In: 1100 [I.V.:1100] Out: 990 [Urine:900; Drains:40; Blood:50] Intake/Output this shift:     Recent Labs  04/17/14 1308 04/18/14 0559  HGB 14.8 12.5*    Recent Labs  04/17/14 1308 04/18/14 0559  WBC 14.9* 14.4*  RBC 5.03 4.31  HCT 43.1 37.6*  PLT 245 218    Recent Labs  04/17/14 1308  NA 137  K 4.7  CL 100  CO2 20  BUN 23  CREATININE 1.29  GLUCOSE 123*  CALCIUM 10.1    Recent Labs  04/17/14 1308  INR 1.05    Neurologically intact ABD soft Neurovascular intact Sensation intact distally No cellulitis present Compartment soft  Assessment/Plan: I had a long discussion with Dr. Ninetta Lights from infectious disease.  Given all the data that we have we feel that the diagnosis here is gout.  We will proceed as if this is the case.  We will keep a close eye on him post hospitalization to make sure that he does not have any recurrence of the effusion that could be consistent with infection. 1 Day Post-Op Procedure(s) (LRB): ARTHROSCOPY KNEE with medial menisectomy, chondralplasty of patella-femoral joint, extensive synovectomy, aspiration of right ankle (Left) Advance diet Up with therapy Discharge home with home health  Harvie Junior 04/18/2014, 12:53 PM

## 2014-04-18 NOTE — Discharge Summary (Signed)
Patient ID: Theodore White MRN: [id number] DOB/AGE: 02/05/1961 53 y.o.  Admit date: 04/17/2014 Discharge date: 04/18/2014  Admission Diagnoses:  Principal Problem:   Acute idiopathic gout of left knee Active Problems:   Septic arthritis of knee, left   Tear of medial meniscus of left knee   Discharge Diagnoses:  Same  Past Medical History  Diagnosis Date  . Hypertension   . Asthma   . Arthritis     "knees and ankles" (04/18/2014)  . Gout   . Chronic kidney disease     stage 3 renal failure under control    Surgeries: Procedure(s):left ARTHROSCOPY KNEE with medial menisectomy, chondralplasty of patella-femoral joint, extensive synovectomy, aspiration of right ankle on 04/17/2014   Consultants:  Dr. Ninetta LightsHatcher    Infectious disease  Discharged Condition: Improved  Hospital Course: Theodore White is an 53 y.o. male who was admitted 04/17/2014 for operative treatment ofAcute idiopathic gout of left knee.he had a large left knee effusion.  In the office on an outpatient setting his synovial fluid, WBC count was 107,000.  He had a sedimentation rate of 24 mm.based upon his clinical and laboratory data we felt compelled to do a left knee arthroscopy with debridement and arthroscopic lavage because of the concern of septic arthritis.  His original knee aspiration did not show crystals.she also complained of chronic swelling and discomfort in his right ankle and this was to be aspirated while under anesthesia. Patient has severe unremitting pain that affects sleep, daily activities, and work/hobbies. After pre-op clearance the patient was taken to the operating room on 04/17/2014 and underwent  Procedure(s):left ARTHROSCOPY KNEE with medial menisectomy, chondralplasty of patella-femoral joint, extensive synovectomy, aspiration of right ankle.    Patient was given perioperative antibiotics: Anti-infectives   Start     Dose/Rate Route Frequency Ordered Stop   04/17/14 1930  vancomycin (VANCOCIN) IVPB  1000 mg/200 mL premix     1,000 mg 200 mL/hr over 60 Minutes Intravenous Every 12 hours 04/17/14 1841 04/19/14 1929   04/17/14 1930  cefTRIAXone (ROCEPHIN) 1 g in dextrose 5 % 50 mL IVPB     1 g 100 mL/hr over 30 Minutes Intravenous Every 24 hours 04/17/14 1835     04/17/14 1556  vancomycin (VANCOCIN) 1 GM/200ML IVPB    Comments:  Mumm, Valerie   : cabinet override      04/17/14 1556 04/17/14 1635       Patient was given sequential compression devices, early ambulation, and chemoprophylaxis to prevent DVT.he was started on IV vancomycin and ceftriaxone per infectious disease.  His aspirate intraoperatively showed intracellular monosodium urate crystals.  WBC count 61 thousand.cultures have shown no growth so far. Infectious disease consult was obtained by Dr. Ninetta LightsHatcher.  His help was appreciated.  He and Dr. Luiz BlareGraves had a lengthy discussion and felt that the patient's symptoms were related to the medial meniscal tear found at surgery, as well as gout.  Patient benefited maximally from hospital stay and there were no complications.    Recent vital signs: Patient Vitals for the past 24 hrs:  BP Temp Temp src Pulse Resp SpO2 Height Weight  04/18/14 0410 136/81 mmHg 98.6 F (37 C) Oral 77 18 94 % - -  04/18/14 0011 127/70 mmHg 98.1 F (36.7 C) Oral 75 18 93 % - -  04/17/14 2024 148/93 mmHg 98.4 F (36.9 C) Oral 79 18 95 % - -  04/17/14 1812 174/98 mmHg 98.4 F (36.9 C) Oral 80 14 100 % - -  04/17/14 1757 - 98.9 F (37.2 C) - - - 94 % - -  04/17/14 1754 162/93 mmHg - - 76 14 93 % - -  04/17/14 1745 162/99 mmHg - - 80 13 97 % - -  04/17/14 1734 - - - 80 17 96 % - -  04/17/14 1722 155/99 mmHg 97.7 F (36.5 C) - 85 20 100 % - -  04/17/14 1347 174/99 mmHg - - - - - - -  04/17/14 1318 - 98.5 F (36.9 C) Oral 79 20 98 % - -  04/17/14 1305 - 98.5 F (36.9 C) Oral 79 20 98 % 6\' 1"  (1.854 m) 110.281 kg (243 lb 2 oz)     Recent laboratory studies:  Recent Labs  04/17/14 1308 04/18/14 0559   WBC 14.9* 14.4*  HGB 14.8 12.5*  HCT 43.1 37.6*  PLT 245 218  NA 137  --   K 4.7  --   CL 100  --   CO2 20  --   BUN 23  --   CREATININE 1.29  --   GLUCOSE 123*  --   INR 1.05  --   CALCIUM 10.1  --      Discharge Medications:     Medication List         allopurinol 300 MG tablet  Commonly known as:  ZYLOPRIM  Take 300 mg by mouth daily.     benazepril 10 MG tablet  Commonly known as:  LOTENSIN  Take 10 mg by mouth daily.     Fluticasone-Salmeterol 100-50 MCG/DOSE Aepb  Commonly known as:  ADVAIR  Inhale 1 puff into the lungs as needed.     HYDROcodone-acetaminophen 5-325 MG per tablet  Commonly known as:  NORCO  Take 1-2 tablets by mouth every 6 (six) hours as needed for moderate pain.     montelukast 10 MG tablet  Commonly known as:  SINGULAIR  Take 10 mg by mouth at bedtime.        Diagnostic Studies:  Chest 2 View  04/17/2014   CLINICAL DATA:  Preoperative evaluation for knee arthroscopy. History of hypertension and asthma.  EXAM: CHEST  2 VIEW  COMPARISON:  None.  FINDINGS: The heart size and mediastinal contours are normal. There is mild diffuse interstitial prominence throughout the lungs which appears chronic. There is no overt pulmonary edema or confluent airspace opacity. There is no pleural effusion or pneumothorax. The osseous structures appear normal.  IMPRESSION: Mild chronic appearing interstitial prominence. No acute cardiopulmonary process.   Electronically Signed   By: Roxy Horseman M.D.   On: 04/17/2014 13:19    Disposition:  Home.      Discharge Instructions   Diet general    Complete by:  As directed      Increase activity slowly as tolerated    Complete by:  As directed      Weight bearing as tolerated    Complete by:  As directed            Follow-up Information   Follow up with GRAVES,JOHN L, MD. Schedule an appointment as soon as possible for a visit in 1 week.   Specialty:  Orthopedic Surgery   Contact information:   [address] [place] [address] [phone]        Signed: MALE MEATH 04/18/2014, 1:01 PM

## 2014-04-18 NOTE — Progress Notes (Signed)
Written and verbal d/c instruction provided. States understanding

## 2014-04-18 NOTE — Discharge Instructions (Signed)
Ambulate weightbearing as tolerated on the left.  Use ice back is much as possible for a few days.

## 2014-04-18 NOTE — Progress Notes (Signed)
INFECTIOUS DISEASE PROGRESS NOTE  ID: Theodore White is a 53 y.o. male with  Principal Problem:   Septic arthritis of knee, left  Subjective: Without complaints, wants to know if he is going home today.   Abtx:  Anti-infectives   Start     Dose/Rate Route Frequency Ordered Stop   04/17/14 1930  vancomycin (VANCOCIN) IVPB 1000 mg/200 mL premix     1,000 mg 200 mL/hr over 60 Minutes Intravenous Every 12 hours 04/17/14 1841 04/19/14 1929   04/17/14 1930  cefTRIAXone (ROCEPHIN) 1 g in dextrose 5 % 50 mL IVPB     1 g 100 mL/hr over 30 Minutes Intravenous Every 24 hours 04/17/14 1835     04/17/14 1556  vancomycin (VANCOCIN) 1 GM/200ML IVPB    Comments:  Mumm, Valerie   : cabinet override      04/17/14 1556 04/17/14 1635      Medications:  Continuous: . sodium chloride 75 mL/hr at 04/17/14 1857    Objective: Vital signs in last 24 hours: Temp:  [97.7 F (36.5 C)-98.9 F (37.2 C)] 98.6 F (37 C) (05/28 0410) Pulse Rate:  [75-85] 77 (05/28 0410) Resp:  [13-20] 18 (05/28 0410) BP: (127-174)/(70-99) 136/81 mmHg (05/28 0410) SpO2:  [93 %-100 %] 94 % (05/28 0410) Weight:  [110.281 kg (243 lb 2 oz)] 110.281 kg (243 lb 2 oz) (05/27 1305)   General appearance: alert, cooperative and no distress Extremities: L knee is wrapped, warm. no proximal or distal erythema.   Lab Results  Recent Labs  04/17/14 1308 04/18/14 0559  WBC 14.9* 14.4*  HGB 14.8 12.5*  HCT 43.1 37.6*  NA 137  --   K 4.7  --   CL 100  --   CO2 20  --   BUN 23  --   CREATININE 1.29  --    Liver Panel  Recent Labs  04/17/14 1308  PROT 8.3  ALBUMIN 4.0  AST 20  ALT 19  ALKPHOS 81  BILITOT 0.7   Sedimentation Rate  Recent Labs  04/18/14 0559  ESRSEDRATE 21*   C-Reactive Protein  Recent Labs  04/17/14 1308  CRP 12.5*    Microbiology: Recent Results (from the past 240 hour(s))  ANAEROBIC CULTURE     Status: None   Collection Time    04/17/14  4:01 PM      Result Value Ref Range  Status   Specimen Description SYNOVIAL FLUID LEFT KNEE   Final   Special Requests 20ML FLUID   Final   Gram Stain     Final   Value: ABUNDANT WBC PRESENT,BOTH PMN AND MONONUCLEAR     NO ORGANISMS SEEN     Performed at [facility]   Culture     Final   Value: NO ANAEROBES ISOLATED; CULTURE IN PROGRESS FOR 5 DAYS     Performed at [facility]   Report Status PENDING   Incomplete  GRAM STAIN     Status: None   Collection Time    04/17/14  4:01 PM      Result Value Ref Range Status   Specimen Description SYNOVIAL FLUID LEFT KNEE   Final   Special Requests 20ML FLUID   Final   Gram Stain     Final   Value: ABUNDANT WBC PRESENT,BOTH PMN AND MONONUCLEAR     NO ORGANISMS SEEN   Report Status 04/17/2014 FINAL   Final  BODY FLUID CULTURE     Status: None  Collection Time    04/17/14  4:01 PM      Result Value Ref Range Status   Specimen Description SYNOVIAL FLUID KNEE LEFT   Final   Special Requests FLUID   Final   Gram Stain     Final   Value: ABUNDANT WBC PRESENT,BOTH PMN AND MONONUCLEAR     NO ORGANISMS SEEN     Performed at [facility]   Culture     Final   Value: NO GROWTH     Performed at [facility]   Report Status PENDING   Incomplete    Studies/Results:  Chest 2 View  04/17/2014   CLINICAL DATA:  Preoperative evaluation for knee arthroscopy. History of hypertension and asthma.  EXAM: CHEST  2 VIEW  COMPARISON:  None.  FINDINGS: The heart size and mediastinal contours are normal. There is mild diffuse interstitial prominence throughout the lungs which appears chronic. There is no overt pulmonary edema or confluent airspace opacity. There is no pleural effusion or pneumothorax. The osseous structures appear normal.  IMPRESSION: Mild chronic appearing interstitial prominence. No acute cardiopulmonary process.   Electronically Signed   By: Roxy Horseman M.D.   On: 04/17/2014 13:19     Assessment/Plan: Septic Arthritis (vs Gout) Total  days of antibiotics: 2  (vanco/ceftriaxone)  Await Cx from office and in hospital No change in anbx for now. Will discuss with Dr Mosie Epstein Infectious Diseases (pager) [phone] [email or web address] 04/18/2014, 10:13 AM  LOS: 1 day   **Disclaimer: This note may have been dictated with voice recognition software. Similar sounding words can inadvertently be transcribed and this note may contain transcription errors which may not have been corrected upon publication of note.**

## 2014-04-18 NOTE — Op Note (Signed)
NAMAlcide Goodness:  Edmonds, Delson                  ACCOUNT NO.:  [id number]  MEDICAL RECORD NO.:  [id number]  LOCATION:  [id number]                        FACILITY:  [facility]  PHYSICIAN:  Harvie JuniorJohn L. Fredrick Dray, M.D.   DATE OF BIRTH:  Dec 08, 1960  DATE OF PROCEDURE:  04/17/2014 DATE OF DISCHARGE:                              OPERATIVE REPORT   PREOPERATIVE DIAGNOSES: 1. Infected right knee. 2. Painful right ankle with questionable fluid collection.  POSTOPERATIVE DIAGNOSES: 1. Infected right knee. 2. Complex medial meniscal tear. 3. Chondromalacia of patellofemoral joint. 4. Painful right ankle with questionable fluid collection.  PRINCIPAL PROCEDURES: 1. Arthroscopic synovectomy four-compartment for infected left knee. 2. Arthroscopic partial medial meniscectomy. 3. Arthroscopic chondroplasty of patellofemoral joint. 4. Aspiration, right ankle.  SURGEON:  Harvie JuniorJohn L. Phillis Thackeray, M.D.  ASSISTANT:  Marshia LyJames Bethune, P.A.  ANESTHESIA:  General.  BRIEF HISTORY:  Mr. Theodore White is a 53 year old male who had a 2-day history of significant pain and swelling in his left knee.  He presented to the office where aspiration was undertaken.  The fluid looked very cloudy and was sent to the lab.  It turned out to have greater than a 100,000 white cells on it.  He was taken to the operating room for arthroscopic lavage.  He had had 2 mL of betamethasone placed in because he had given us a history that he had had gout, although his fluid analysis had no evidence of gouty crystals.  After return of the fluid with 107,000 white cells with 97% neutrophils, we felt that arthroscopic I and D was appropriate.  He was brought to the operating room for this procedure.  DESCRIPTION OF PROCEDURE:  The patient was taken to the operating room and after adequate anesthesia was obtained with general anesthetic, the patient was placed supine on the operating table.  The left leg was then prepped and draped in usual sterile fashion.  Following this,  routine arthroscopic examination of the knee revealed there was a fairly erythematous synovium, did not look proliferative, but certainly was there.  There appeared to be some crystal deposed in that area, a synovectomy was started at this point, superiorly down the medial gutter and anteriorly, we got into the medial compartment and there was a large complex medial meniscal tear with a large meniscal flap, which had flipped anteriorly into the knee and this was debrided with a combination of straight biting forceps, the suction shaver and then this portion of the meniscus had to be removed and mass with a grasper.  Once this was done, attention was turned to the notch where there was significant erythematous synovium, which was debrided, turned laterally where the cartilage looked pristine.  Lateral meniscus was normal. There was some synovium anterolaterally.  Went up into the lateral gutter and did a thorough debridement here.  Went back superiorly, made a lateral portal, got down the anterior aspect of the femur and did a thorough synovectomy addressing the synovium on the anterior aspect of the femur.  Put a cannula and through this, and then a drain and through here, we looked visually at the drain to make sure that it was not caught up in the synovium, but  was free in the joint.  Once this was accomplished, the drain was sewed in and the wounds were closed with interrupted nylon.  Sterile compressive dressing was applied.  Attention at this time was turned to the right ankle because of the history of gout and inflammation and pain that he had in the right ankle and aspirate of the right ankle was attempted.  No fluid was achieved. Estimated blood loss for the procedure was minimal and the complications were none.     Harvie Junior, M.D.     Ranae Plumber  D:  04/17/2014  T:  04/18/2014  Job:  [id number]

## 2014-04-21 LAB — BODY FLUID CULTURE: Culture: NO GROWTH

## 2014-04-22 LAB — ANAEROBIC CULTURE

## 2014-04-24 LAB — CULTURE, BLOOD (ROUTINE X 2): Culture: NO GROWTH

## 2014-05-31 LAB — AFB CULTURE WITH SMEAR (NOT AT ARMC): Acid Fast Smear: NONE SEEN

## 2015-10-07 DIAGNOSIS — R0789 Other chest pain: Secondary | ICD-10-CM | POA: Insufficient documentation

## 2015-10-07 DIAGNOSIS — R002 Palpitations: Secondary | ICD-10-CM | POA: Insufficient documentation

## 2015-10-08 ENCOUNTER — Other Ambulatory Visit: Payer: Self-pay | Admitting: Internal Medicine

## 2015-10-08 ENCOUNTER — Encounter: Payer: Self-pay | Admitting: Internal Medicine

## 2015-10-08 ENCOUNTER — Telehealth: Payer: Self-pay | Admitting: Internal Medicine

## 2015-10-08 DIAGNOSIS — I129 Hypertensive chronic kidney disease with stage 1 through stage 4 chronic kidney disease, or unspecified chronic kidney disease: Secondary | ICD-10-CM | POA: Insufficient documentation

## 2015-10-08 DIAGNOSIS — N183 Chronic kidney disease, stage 3 unspecified: Secondary | ICD-10-CM | POA: Insufficient documentation

## 2015-10-08 DIAGNOSIS — E7800 Pure hypercholesterolemia, unspecified: Secondary | ICD-10-CM | POA: Insufficient documentation

## 2015-10-08 DIAGNOSIS — R0683 Snoring: Secondary | ICD-10-CM | POA: Insufficient documentation

## 2015-10-08 NOTE — Telephone Encounter (Signed)
[facility] for appointment on 10/22/15 with Dr Rennis GoldenHilty.  [facility] for Dr Blanchie DessertHIlty's schedule on 10/22/15. lp

## 2015-10-22 ENCOUNTER — Ambulatory Visit (INDEPENDENT_AMBULATORY_CARE_PROVIDER_SITE_OTHER): Payer: PRIVATE HEALTH INSURANCE | Admitting: Internal Medicine

## 2015-10-22 ENCOUNTER — Encounter: Payer: Self-pay | Admitting: Internal Medicine

## 2015-10-22 VITALS — BP 132/90 | HR 75 | Ht 73.0 in | Wt 263.8 lb

## 2015-10-22 DIAGNOSIS — R002 Palpitations: Secondary | ICD-10-CM | POA: Diagnosis not present

## 2015-10-22 DIAGNOSIS — G4719 Other hypersomnia: Secondary | ICD-10-CM | POA: Diagnosis not present

## 2015-10-22 DIAGNOSIS — R0681 Apnea, not elsewhere classified: Secondary | ICD-10-CM

## 2015-10-22 DIAGNOSIS — I10 Essential (primary) hypertension: Secondary | ICD-10-CM

## 2015-10-22 DIAGNOSIS — E7800 Pure hypercholesterolemia, unspecified: Secondary | ICD-10-CM

## 2015-10-22 DIAGNOSIS — R0789 Other chest pain: Secondary | ICD-10-CM | POA: Diagnosis not present

## 2015-10-22 NOTE — Progress Notes (Signed)
OFFICE NOTE  Chief Complaint:  Chest pain, palpitations  Primary Care Physician: Astrid Divine, MD  HPI:  Theodore White is a 54 year old male patient who is referred to me for evaluation of palpitations and chest pain. He reports about 3 weeks ago while eating a meal he was noted to have a feeling of a wave of lightheadedness go across his head. This was followed by racing of his heart, diaphoresis and some right sided chest pressure. Apparently both of his daughters are nurses and they were able to put her hand on his chest and guesstimated his heart rate was possibly close to 200. This lasted apparently for about 10-15 minutes and then resolve spontaneously. He had a similar episode perhaps about 6 months ago but that did not last but a few minutes. He has not reported any worsening shortness of breath or chest pain with exertion, but is been fairly sedentary since his knee surgery about a year ago. He does have cardiac risk factors including hypertension and reportedly has stage III chronic kidney disease secondary to hypertension, but he is only on a low-dose of benazepril and blood pressure is fairly well-controlled. His creatinine most recently was 1.32 which is not significantly reduced given his age. Nevertheless, he does have an abnormal EKG which shows significant LVH by voltage and T-wave inversions which are deep in leads V3 through V6 as well as 1 and aVL and lead 2. He did fill out a sleep questionnaire which indicate significant chance of dosing with a sleepiness score greater than 10. His wife reports that he is a snorer and has been noted to stop breathing at times. He does feel daytime fatigue and somnolence and has a thick neck.  PMHx:  Past Medical History  Diagnosis Date  . Hypertension   . Asthma   . Arthritis     "knees and ankles" (04/18/2014)  . Gout   . Chronic kidney disease     stage 3 renal failure under control    Past Surgical History  Procedure  Laterality Date  . Colonoscopy    . Tooth extraction      "just 2 teeth"  . Knee arthroscopy with medial menisectomy Left 04/17/2014    chondralplasty of patella-femoral joint, extensive synovectomy  . Aspiration biopsy Right 04/17/2014    ankle; "to assess swelling; they weren't able to aspirate anything though"  . Knee arthroscopy Left 04/17/2014    Procedure: ARTHROSCOPY KNEE with medial menisectomy, chondralplasty of patella-femoral joint, extensive synovectomy, aspiration of right ankle;  Surgeon: Harvie Junior, MD;  Location: [facility] OR;  Service: Orthopedics;  Laterality: Left;    FAMHx:  Family History  Problem Relation Age of Onset  . Asthma Mother   . Heart disease Father   . Sleep apnea Father   . Prostate cancer Father   . Healthy Brother   . Drug abuse Brother   . Healthy Brother     SOCHx:   reports that he has never smoked. He has never used smokeless tobacco. He reports that he does not drink alcohol or use illicit drugs.  ALLERGIES:  Allergies  Allergen Reactions  . Penicillins Other (See Comments)    Had med as a child doesn't know the reaction    ROS: A comprehensive review of systems was negative except for: Cardiovascular: positive for chest pain, fatigue and palpitations  HOME MEDS: Current Outpatient Prescriptions  Medication Sig Dispense Refill  . allopurinol (ZYLOPRIM) 300 MG tablet Take 300 mg  by mouth daily.    . benazepril (LOTENSIN) 10 MG tablet Take 10 mg by mouth daily.    . Fluticasone-Salmeterol (ADVAIR) 100-50 MCG/DOSE AEPB Inhale 1 puff into the lungs as needed (SOB).     Marland Kitchen. HYDROcodone-acetaminophen (NORCO) 5-325 MG per tablet Take 1-2 tablets by mouth every 6 (six) hours as needed for moderate pain. 40 tablet 0  . montelukast (SINGULAIR) 10 MG tablet Take 10 mg by mouth at bedtime.     No current facility-administered medications for this visit.    LABS/IMAGING: No results found for this or any previous visit (from the past 48  hour(s)). No results found.  WEIGHTS: Wt Readings from Last 3 Encounters:  10/22/15 263 lb 12.8 oz (119.659 kg)  04/17/14 243 lb 2 oz (110.281 kg)    VITALS: BP 132/90 mmHg  Pulse 75  Ht 6\' 1"  (1.854 m)  Wt 263 lb 12.8 oz (119.659 kg)  BMI 34.81 kg/m2  EXAM: General appearance: alert, no distress and moderately obese Neck: no JVD and supple, symmetrical, trachea midline Lungs: clear to auscultation bilaterally Heart: regular rate and rhythm, S1, S2 normal, no murmur, click, rub or gallop Abdomen: soft, non-tender; bowel sounds normal; no masses,  no organomegaly Extremities: extremities normal, atraumatic, no cyanosis or edema Pulses: 2+ and symmetric Skin: Skin color, texture, turgor normal. No rashes or lesions Neurologic: Grossly normal Psych: Pleasant  EKG: Normal sinus rhythm at 75, LVH by voltage with T-wave inversions in V3 through V6, 1 and aVL which may represent repolarization abnormality or ischemia  ASSESSMENT: 1. Chest pain and tachycardia palpitations 2. Abnormal EKG which could represent ischemia, LVH with strain or even apical hypertrophic cardiopathy 3. Moderate obesity with witnessed apnea and snoring 4. Hypertension  PLAN: 1.   Theodore White has a number of cardiac risk factors and is been having episodes of paroxysmal tachycardia. Heart rate apparently was close to 200. EKG is abnormal and might suggest that he has either hypertensive or hypertrophic cardiomyopathy or ischemic coronary disease. I'm recommending a nuclear stress test to evaluate for ischemia and an echocardiogram to look for structural abnormalities to the heart. In addition, he has a high sleepiness score greater than 10 with witnessed apnea, snoring, moderate obesity and a thick neck that is concerning for obstructive sleep apnea. I like to order a split-night study. Plan to see him back to discuss the results of the studies in a few weeks.  Thanks again for the kind referral.  Chrystie NoseKenneth C.  Azazel Franze, MD, [facility] Attending Cardiologist [facility]  Chrystie NoseKenneth C Theodore White 10/22/2015, 1:16 PM

## 2015-10-22 NOTE — Patient Instructions (Signed)
Your physician has requested that you have an echocardiogram. Echocardiography is a painless test that uses sound waves to create images of your heart. It provides your doctor with information about the size and shape of your heart and how well your heart's chambers and valves are working. This procedure takes approximately one hour. There are no restrictions for this procedure.   Your physician has requested that you have a lexiscan myoview. For further information please visit [email or web address]. Please follow instruction sheet, as given.  Your physician has recommended that you have a sleep study. This test records several body functions during sleep, including: brain activity, eye movement, oxygen and carbon dioxide blood levels, heart rate and rhythm, breathing rate and rhythm, the flow of air through your mouth and nose, snoring, body muscle movements, and chest and belly movement.  Your physician recommends that you schedule a follow-up appointment in: 2-3 WEEKS with Dr. Rennis GoldenHilty to discuss tests.

## 2015-10-28 ENCOUNTER — Telehealth (HOSPITAL_COMMUNITY): Payer: Self-pay

## 2015-10-28 NOTE — Telephone Encounter (Signed)
Encounter complete. 

## 2015-10-30 ENCOUNTER — Ambulatory Visit (HOSPITAL_COMMUNITY)
Admission: RE | Admit: 2015-10-30 | Discharge: 2015-10-30 | Disposition: A | Discharge status: Home / Self Care | Payer: PRIVATE HEALTH INSURANCE | Source: Ambulatory Visit | Attending: Cardiovascular Disease | Admitting: Cardiovascular Disease

## 2015-10-30 ENCOUNTER — Other Ambulatory Visit: Payer: Self-pay

## 2015-10-30 ENCOUNTER — Ambulatory Visit (HOSPITAL_BASED_OUTPATIENT_CLINIC_OR_DEPARTMENT_OTHER): Payer: PRIVATE HEALTH INSURANCE

## 2015-10-30 ENCOUNTER — Encounter (HOSPITAL_COMMUNITY): Payer: Self-pay

## 2015-10-30 DIAGNOSIS — R002 Palpitations: Secondary | ICD-10-CM | POA: Diagnosis not present

## 2015-10-30 DIAGNOSIS — Z6834 Body mass index (BMI) 34.0-34.9, adult: Secondary | ICD-10-CM | POA: Diagnosis not present

## 2015-10-30 DIAGNOSIS — E669 Obesity, unspecified: Secondary | ICD-10-CM | POA: Diagnosis not present

## 2015-10-30 DIAGNOSIS — R61 Generalized hyperhidrosis: Secondary | ICD-10-CM | POA: Diagnosis not present

## 2015-10-30 DIAGNOSIS — R42 Dizziness and giddiness: Secondary | ICD-10-CM | POA: Diagnosis not present

## 2015-10-30 DIAGNOSIS — R0789 Other chest pain: Secondary | ICD-10-CM

## 2015-10-30 DIAGNOSIS — I129 Hypertensive chronic kidney disease with stage 1 through stage 4 chronic kidney disease, or unspecified chronic kidney disease: Secondary | ICD-10-CM | POA: Insufficient documentation

## 2015-10-30 DIAGNOSIS — I517 Cardiomegaly: Secondary | ICD-10-CM | POA: Insufficient documentation

## 2015-10-30 DIAGNOSIS — R000 Tachycardia, unspecified: Secondary | ICD-10-CM | POA: Diagnosis not present

## 2015-10-30 DIAGNOSIS — R9439 Abnormal result of other cardiovascular function study: Secondary | ICD-10-CM | POA: Diagnosis not present

## 2015-10-30 DIAGNOSIS — E785 Hyperlipidemia, unspecified: Secondary | ICD-10-CM | POA: Insufficient documentation

## 2015-10-30 DIAGNOSIS — Z8249 Family history of ischemic heart disease and other diseases of the circulatory system: Secondary | ICD-10-CM | POA: Diagnosis not present

## 2015-10-30 DIAGNOSIS — N183 Chronic kidney disease, stage 3 (moderate): Secondary | ICD-10-CM | POA: Diagnosis not present

## 2015-10-30 LAB — MYOCARDIAL PERFUSION IMAGING
LV dias vol: 107 mL
LV sys vol: 47 mL
Peak HR: 88 {beats}/min
Rest HR: 68 {beats}/min
SDS: 2
SRS: 9
SSS: 11
TID: 1.05

## 2015-10-30 MED ORDER — PERFLUTREN LIPID MICROSPHERE
1.0000 mL | INTRAVENOUS | Status: AC | PRN
  Administered 2015-10-30: 2 mL via INTRAVENOUS

## 2015-10-30 MED ORDER — TECHNETIUM TC 99M SESTAMIBI GENERIC - CARDIOLITE
31.3000 | Freq: Once | INTRAVENOUS | Status: AC | PRN
  Administered 2015-10-30: 31.3 via INTRAVENOUS

## 2015-10-30 MED ORDER — TECHNETIUM TC 99M SESTAMIBI GENERIC - CARDIOLITE
10.5000 | Freq: Once | INTRAVENOUS | Status: AC | PRN
  Administered 2015-10-30: 11 via INTRAVENOUS

## 2015-10-30 MED ORDER — REGADENOSON 0.4 MG/5ML IV SOLN
0.4000 mg | Freq: Once | INTRAVENOUS | Status: AC
  Administered 2015-10-30: 0.4 mg via INTRAVENOUS

## 2015-11-07 ENCOUNTER — Encounter: Payer: Self-pay | Admitting: Internal Medicine

## 2015-11-07 NOTE — Telephone Encounter (Signed)
Pending result notes from provider.

## 2015-11-07 NOTE — Telephone Encounter (Signed)
Pt's wife is calling in to see of the results from is Echo and Stress Test were available. Please f/u with her  Thanks

## 2015-11-10 NOTE — Telephone Encounter (Signed)
Routed to [facility] Kidney[facility] this am.  Dr. HRexene Edison

## 2015-11-10 NOTE — Telephone Encounter (Signed)
This encounter was created in error - please disregard.

## 2015-11-28 ENCOUNTER — Encounter (HOSPITAL_BASED_OUTPATIENT_CLINIC_OR_DEPARTMENT_OTHER): Payer: PRIVATE HEALTH INSURANCE

## 2015-12-03 ENCOUNTER — Ambulatory Visit (HOSPITAL_BASED_OUTPATIENT_CLINIC_OR_DEPARTMENT_OTHER): Payer: PRIVATE HEALTH INSURANCE | Attending: Internal Medicine

## 2015-12-03 VITALS — Ht 73.0 in | Wt 262.0 lb

## 2015-12-03 DIAGNOSIS — R0681 Apnea, not elsewhere classified: Secondary | ICD-10-CM

## 2015-12-03 DIAGNOSIS — G4733 Obstructive sleep apnea (adult) (pediatric): Secondary | ICD-10-CM | POA: Diagnosis not present

## 2015-12-03 DIAGNOSIS — I10 Essential (primary) hypertension: Secondary | ICD-10-CM | POA: Diagnosis not present

## 2015-12-03 DIAGNOSIS — R0683 Snoring: Secondary | ICD-10-CM | POA: Diagnosis not present

## 2015-12-03 DIAGNOSIS — G4719 Other hypersomnia: Secondary | ICD-10-CM | POA: Diagnosis not present

## 2015-12-03 DIAGNOSIS — E669 Obesity, unspecified: Secondary | ICD-10-CM | POA: Diagnosis not present

## 2015-12-03 DIAGNOSIS — Z6835 Body mass index (BMI) 35.0-35.9, adult: Secondary | ICD-10-CM | POA: Insufficient documentation

## 2015-12-03 DIAGNOSIS — R5383 Other fatigue: Secondary | ICD-10-CM | POA: Insufficient documentation

## 2015-12-03 DIAGNOSIS — Z79899 Other long term (current) drug therapy: Secondary | ICD-10-CM | POA: Insufficient documentation

## 2015-12-04 ENCOUNTER — Encounter: Payer: Self-pay | Admitting: Internal Medicine

## 2015-12-04 ENCOUNTER — Ambulatory Visit (INDEPENDENT_AMBULATORY_CARE_PROVIDER_SITE_OTHER): Payer: PRIVATE HEALTH INSURANCE | Admitting: Internal Medicine

## 2015-12-04 VITALS — BP 136/80 | HR 68 | Ht 73.0 in | Wt 267.2 lb

## 2015-12-04 DIAGNOSIS — I10 Essential (primary) hypertension: Secondary | ICD-10-CM | POA: Diagnosis not present

## 2015-12-04 DIAGNOSIS — I4940 Unspecified premature depolarization: Secondary | ICD-10-CM | POA: Diagnosis not present

## 2015-12-04 DIAGNOSIS — R9431 Abnormal electrocardiogram [ECG] [EKG]: Secondary | ICD-10-CM | POA: Diagnosis not present

## 2015-12-04 DIAGNOSIS — N183 Chronic kidney disease, stage 3 unspecified: Secondary | ICD-10-CM

## 2015-12-04 DIAGNOSIS — E7800 Pure hypercholesterolemia, unspecified: Secondary | ICD-10-CM

## 2015-12-04 LAB — BASIC METABOLIC PANEL
BUN/Creatinine Ratio: 12 (ref 9–20)
BUN: 14 mg/dL (ref 6–24)
CO2: 28 mmol/L (ref 18–29)
Calcium: 9.5 mg/dL (ref 8.7–10.2)
Chloride: 103 mmol/L (ref 96–106)
Creatinine, Ser: 1.18 mg/dL (ref 0.76–1.27)
GFR calc Af Amer: 81 mL/min/{1.73_m2} (ref >59)
GFR calc non Af Amer: 70 mL/min/{1.73_m2} (ref >59)
Glucose: 107 mg/dL — ABNORMAL HIGH (ref 65–99)
Potassium: 4.7 mmol/L (ref 3.5–5.2)
Sodium: 138 mmol/L (ref 134–144)

## 2015-12-04 MED ORDER — METOPROLOL SUCCINATE ER 25 MG PO TB24: 12.5000 mg | ORAL_TABLET | Freq: Every day | ORAL | Status: DC

## 2015-12-04 NOTE — Patient Instructions (Signed)
Medication Instructions:  START Metoprolol Succinate 25 mg - take 0.5 tablet (12.5 mg total) by mouth daily  >>A new prescription has been sent to the pharmacy electronically.  Labwork: Your physician recommends that you return for lab work prior to your cardiac MRI. The scheduler will let you know when to complete this.  Testing/Procedures: 1. Cardiac MRI - see information below. [facility].  Follow-Up: Dr Rennis GoldenHilty recommends that you schedule a follow-up appointment after your MRI. (OK to double book)  If you need a refill on your cardiac medications before your next appointment, please call your pharmacy.    Magnetic Resonance Imaging Magnetic resonance imaging (MRI) is an imaging test that produces clear digital pictures of the inside of your body without using X-rays. The MRI scanner uses radio waves and a magnetic field to create the images. The MRI pictures may provide different details than images obtained through X-rays, CT scans, or ultrasounds. Contrast material may be injected to make MRI images even more clear. In a standard MRI scanner, the area of your body being studied will be in the center opening of the scanner. In open MRI scanners, the scanner does not entirely surround your body.  LET [facility] KNOW ABOUT:  Previous surgeries you have had.  Any metal you may have in your body. The magnet used in MRI can cause metal objects in your body to move. This includes:  A pacemaker or any other implants, such as an implanted neurostimulator, a metallic ear implant, or a metallic object within the eye socket.  Metal splinters in your body.  Any bullet fragments.  A port for delivering insulin or chemotherapy.  Any tattoos. Some red dyes contain iron which is sometimes a problem.  If you are pregnant or think you may be pregnant.  If you are breastfeeding.  If you are afraid of cramped spaces (claustrophobic). If  claustrophobia is a problem, it usually can be relieved with medicines or the use of the open MRI scanner.  Any allergies you have.  All medicines you are taking, including vitamins, herbs, eye drops, creams, and over-the-counter medicines. RISKS AND COMPLICATIONS  Generally, MRI is a safe procedure. However, problems can occur and include:  If a metal implant is present but is undetected, it may be affected by the strong magnetic field. In addition, if the implant is close to the examination site, it may be hard to get high-quality images.  If you are pregnant:  MRI generally should be avoided during the first three months of pregnancy. It is not known what effects the MRI may have on a fetus. Ultrasound is preferred at this time unless a serious condition is suspected that is best studied by MRI. MRI should be considered if there is a substantial risk of missing the correct diagnosis if MRI is not done.  If you are breastfeeding:  You should inform your health care provider and ask how to proceed. You may pump breast milk before the exam for use until the contrast material, if used, has cleared from the body. BEFORE THE PROCEDURE   You will be asked to remove all metal, including:  Your watch, jewelry, and other metal objects.  Some makeup also contains traces of metal and may need to be removed.  Braces and fillings normally are not a problem. PROCEDURE  You may be given earplugs or headphones to listen to music. The MRI scanner can be noisy.  You may  be injected with contrast material.  The standard MRI is done in a long, magnetic chamber. You will lie down on a platform that slides into the magnetic chamber. Once inside, you will still be able to talk to the person performing the test. The open MRI scanner is open on at least one side of the scanner.  You will be asked to hold very still. You will be told when you can shift position. You may have to wait a few minutes to make  sure the images are readable. AFTER THE PROCEDURE   You may resume normal activities right away.  If you were given contrast material, it will pass naturally through your body within a day.  A person experienced in MRI (radiologist) will analyze the results and send a report to your health care provider, along with an explanation of the results.   This information is not intended to replace advice given to you by your health care provider. Make sure you discuss any questions you have with your health care provider.   Document Released: 11/05/2000 Document Revised: 11/29/2014 Document Reviewed: 01/03/2014 Elsevier Interactive Patient Education [employer]

## 2015-12-04 NOTE — Progress Notes (Signed)
OFFICE NOTE  Chief Complaint:  Follow-up testing  Primary Care Physician: Astrid DivineGRIFFIN,ELAINE COLLINS, MD  HPI:  Theodore White is a 55 year old male patient who is referred to me for evaluation of palpitations and chest pain. He reports about 3 weeks ago while eating a meal he was noted to have a feeling of a wave of lightheadedness go across his head. This was followed by racing of his heart, diaphoresis and some right sided chest pressure. Apparently both of his daughters are nurses and they were able to put her hand on his chest and guesstimated his heart rate was possibly close to 200. This lasted apparently for about 10-15 minutes and then resolve spontaneously. He had a similar episode perhaps about 6 months ago but that did not last but a few minutes. He has not reported any worsening shortness of breath or chest pain with exertion, but is been fairly sedentary since his knee surgery about a year ago. He does have cardiac risk factors including hypertension and reportedly has stage III chronic kidney disease secondary to hypertension, but he is only on a low-dose of benazepril and blood pressure is fairly well-controlled. His creatinine most recently was 1.32 which is not significantly reduced given his age. Nevertheless, he does have an abnormal EKG which shows significant LVH by voltage and T-wave inversions which are deep in leads V3 through V6 as well as 1 and aVL and lead 2. He did fill out a sleep questionnaire which indicate significant chance of dosing with a sleepiness score greater than 10. His wife reports that he is a snorer and has been noted to stop breathing at times. He does feel daytime fatigue and somnolence and has a thick neck.  Theodore White returns today for follow-up. He reports feeling fairly well. He denies any chest pain or shortness of breath. He did undergo a sleep study last evening and felt somewhat tired today. He says that they stop the study part way and fit him with a  mask, but he seemed to be intolerant of it. We do not have a final report but it suggests that he does have some sleep apnea. I encouraged him to be open minded about wearing a CPAP as it may be beneficial for him. His wife reiterated that he is been snoring and having apneic episodes for 20-30 years she's known him. With regards to his testing, he underwent nuclear stress testing as well as an echocardiogram. The results are below:   The left ventricular ejection fraction is normal (55-65%).  Nuclear stress EF: 56%.  There was no ST segment deviation noted during stress.  This is a low risk study.  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  Study Conclusions  - Procedure narrative: Transthoracic echocardiography. Image quality was adequate. The study was technically difficult, as a result of poor acoustic windows. Intravenous contrast (Definity) was administered. - Left ventricle: The cavity size was normal. There was mild concentric hypertrophy. Systolic function was normal. The estimated ejection fraction was in the range of 60% to 65%. Perine motion was normal; there were no regional Duthie motion abnormalities. Doppler parameters are consistent with abnormal left ventricular relaxation (grade 1 diastolic dysfunction). - Aortic valve: There was no regurgitation. - Aortic root: The aortic root was normal in size. - Ascending aorta: The ascending aorta was normal  in size. - Mitral valve: There was no regurgitation. - Left atrium: The atrium was moderately dilated. - Right ventricle: Systolic function was normal. - Right atrium: The atrium was normal in size. - Tricuspid valve: There was no regurgitation. - Pulmonic valve: There was no regurgitation. - Pulmonary arteries:  Systolic pressure was within the normal range. - Inferior vena cava: The vessel was normal in size. - Pericardium, extracardiac: There was no pericardial effusion.  I discussed the results with him as well as the fact that there are discrepancies. The nuclear stress test suggests there is a fixed anterior Lyssy defect concerning for prior infarct as well as significant apical uptake which may be related to apical hypertrophic cardiomyopathy. Interestingly, the LVEF is normal and the anterior Gaul motion is normal. The echocardiogram also showed normal anterior Schnyder motion without any evidence of scar however the study was limited and required contrast to get decent images. There is certainly discrepancy between both studies with equivocal findings. His EKG showing deep anterior and lateral T-wave inversions could suggest both ischemia, scar or apical hypertrophic cardiomyopathy. Is not certain whether the tests have answer the question of what the cause of his abnormal EKG changes are.  PMHx:  Past Medical History  Diagnosis Date  . Hypertension   . Asthma   . Arthritis     "knees and ankles" (04/18/2014)  . Gout   . Chronic kidney disease     stage 3 renal failure under control    Past Surgical History  Procedure Laterality Date  . Colonoscopy    . Tooth extraction      "just 2 teeth"  . Knee arthroscopy with medial menisectomy Left 04/17/2014    chondralplasty of patella-femoral joint, extensive synovectomy  . Aspiration biopsy Right 04/17/2014    ankle; "to assess swelling; they weren't able to aspirate anything though"  . Knee arthroscopy Left 04/17/2014    Procedure: ARTHROSCOPY KNEE with medial menisectomy, chondralplasty of patella-femoral joint, extensive synovectomy, aspiration of right ankle;  Surgeon: Harvie Junior, MD;  Location: [facility] OR;  Service: Orthopedics;  Laterality: Left;    FAMHx:  Family History  Problem Relation Age of Onset  . Asthma Mother   . Heart disease  Father   . Sleep apnea Father   . Prostate cancer Father   . Healthy Brother   . Drug abuse Brother   . Healthy Brother     SOCHx:   reports that he has never smoked. He has never used smokeless tobacco. He reports that he does not drink alcohol or use illicit drugs.  ALLERGIES:  Allergies  Allergen Reactions  . Penicillins Other (See Comments)    Had med as a child doesn't know the reaction    ROS: A comprehensive review of systems was negative except for: Cardiovascular: positive for chest pain, fatigue and palpitations  HOME MEDS: Current Outpatient Prescriptions  Medication Sig Dispense Refill  . allopurinol (ZYLOPRIM) 300 MG tablet Take 300 mg by mouth daily.    . benazepril (LOTENSIN) 10 MG tablet Take 10 mg by mouth daily.    . Fluticasone-Salmeterol (ADVAIR) 100-50 MCG/DOSE AEPB Inhale 1 puff into the lungs as needed (SOB).     Marland Kitchen HYDROcodone-acetaminophen (NORCO) 5-325 MG per tablet Take 1-2 tablets by mouth every 6 (six) hours as needed for moderate pain. 40 tablet 0  . montelukast (SINGULAIR) 10 MG tablet Take 10 mg by mouth at bedtime.    . metoprolol succinate (TOPROL XL) 25 MG 24 hr  tablet Take 0.5 tablets (12.5 mg total) by mouth daily. 15 tablet 11   No current facility-administered medications for this visit.    LABS/IMAGING: No results found for this or any previous visit (from the past 48 hour(s)). No results found.  WEIGHTS: Wt Readings from Last 3 Encounters:  12/04/15 267 lb 3 oz (121.195 kg)  12/03/15 262 lb (118.842 kg)  10/30/15 263 lb (119.296 kg)    VITALS: BP 136/80 mmHg  Pulse 68  Ht 6\' 1"  (1.854 m)  Wt 267 lb 3 oz (121.195 kg)  BMI 35.26 kg/m2  EXAM: Deferred  EKG: Deferred  ASSESSMENT:  Chest pain and tachy-palpitations  Abnormal EKG which could represent ischemia, LVH with strain or even apical hypertrophic cardiopathy  Moderate obesity with witnessed apnea and snoring  Hypertension  PLAN: 1.   Mr. Tones had a nuclear  stress test and echocardiogram which showed discrepant results. In the stress test indicates possible anterior scar with a apical hypertrophy. The echocardiogram however does not show apical variant hypertrophic cardio myopathy or evidence of Mccleery motion abnormality, thickening or other signs to support an anterior scar. He still has significant EKG changes which need further evaluation. He will need definitive coronary evaluation in my opinion as well as evaluation for scar and whether or not he may have apical hypertrophic changes. I think the best test to evaluate all 3 of these in the setting of equivocal imaging finding would be a cardiac MRI. This was given stability to identify whether there is any evidence of old scar, it is the best test to look at apical hypertrophic cardio myopathy, and would do a decent job of identifying whether there is any significant obstructive coronary disease. As this requires gating and a low heart rate, coupled with the fact that he's having palpitations, by discharge much low-dose Toprol-XL 12.5 mg daily to lower his heart rate even further. Hopefully this will help with palpitations and improve image quality. He is on daily aspirin. Based on these findings further testing may be necessary, including possible cardiac catheterization. He is in agreement with this workup.  Plan to see him back to discuss the findings of his cardiac MRI once they're available.  Chrystie Nose, MD, [facility] Attending Cardiologist [facility]  Lisette Abu Hilty 12/04/2015, 11:12 AM

## 2015-12-09 ENCOUNTER — Encounter: Payer: Self-pay | Admitting: Internal Medicine

## 2015-12-17 NOTE — Sleep Study (Signed)
Patient Name: Theodore White, Theodore White Date: 12/03/2015 Gender: Male D.O.B: 06/19/1961 Age (years): 54 Referring Provider: Nadean Corwin Hilty Height (inches): 73 Interpreting Physician: Shelva Majestic MD, ABSM Weight (lbs): 262 RPSGT: Laren Everts BMI: 35 MRN: [id number] Neck Size: 18.50  CLINICAL INFORMATION Sleep Study Type: Split Night CPAP  Indication for sleep study: Excessive Daytime Sleepiness, Fatigue, Hypertension, Obesity, OSA, Snoring, Witnessed Apneas  Epworth Sleepiness Score: 7  SLEEP STUDY TECHNIQUE As per the AASM Manual for the Scoring of Sleep and Associated Events v2.3 (April 2016) with a hypopnea requiring 4% desaturations. The channels recorded and monitored were frontal, central and occipital EEG, electrooculogram (EOG), submentalis EMG (chin), nasal and oral airflow, thoracic and abdominal Ange motion, anterior tibialis EMG, snore microphone, electrocardiogram, and pulse oximetry. Continuous positive airway pressure (CPAP) was initiated when the patient met split night criteria and was titrated according to treat sleep-disordered breathing.  MEDICATIONS  allopurinol (ZYLOPRIM) 300 MG tablet 300 mg, Daily     benazepril (LOTENSIN) 10 MG tablet 10 mg, Daily     Fluticasone-Salmeterol (ADVAIR) 100-50 MCG/DOSE AEPB 1 puff, As needed     HYDROcodone-acetaminophen (NORCO) 5-325 MG per tablet 1-2 tablet, Every 6 hours PRN     metoprolol succinate (TOPROL XL) 25 MG 24 hr tablet 12.5 mg, Daily     montelukast (SINGULAIR) 10 MG tablet 10 mg, Daily at bedtime   Medications administered by patient during sleep study : No sleep medicine administered.  RESPIRATORY PARAMETERS Diagnostic Total AHI (/hr): 46.9 RDI (/hr): 51.1 OA Index (/hr): 18.5 CA Index (/hr): 0.0 REM AHI (/hr): 26.1 NREM AHI (/hr): 49.0 Supine AHI (/hr): 55.0 Non-supine AHI (/hr): 18.95 Min O2 Sat (%): 86.00 Mean O2 (%): 93.76 Time below 88% (min): 1.2   Titration Optimal Pressure (cm): 11 AHI at  Optimal Pressure (/hr): 0.0 Min O2 at Optimal Pressure (%): 92.0 Supine % at Optimal (%): 100 Sleep % at Optimal (%): 100    SLEEP ARCHITECTURE The recording time for the entire night was 409.1 minutes. During a baseline period of 183.8 minutes, the patient slept for 126.7 minutes in REM and nonREM, yielding a sleep efficiency of 68.9%. Sleep onset after lights out was 6.6 minutes with a REM latency of 129.0 minutes. The patient spent 12.23% of the night in stage N1 sleep, 78.69% in stage N2 sleep, 0.00% in stage N3 and 9.08% in REM.  During the titration period of 214.6 minutes, the patient slept for 179.0 minutes in REM and nonREM, yielding a sleep efficiency of 83.4%. Sleep onset after CPAP initiation was 15.7 minutes with a REM latency of 79.0 minutes. The patient spent 4.75% of the night in stage N1 sleep, 67.61% in stage N2 sleep, 1.40% in stage N3 and 26.24% in REM.  CARDIAC DATA The 2 lead EKG demonstrated sinus rhythm. The mean heart rate was 58.73 beats per minute. Other EKG findings include: None.  LEG MOVEMENT DATA The total Periodic Limb Movements of Sleep (PLMS) were 138. The PLMS index was 26.80 .  IMPRESSIONS - Severe obstructive sleep apnea occurred during the diagnostic portion of the study (AHI = 46.9/hour). An optimal PAP pressure was selected for this patient ( 11 cm of water) - No significant central sleep apnea occurred during the diagnostic portion of the study (CAI = 0.0/hour). - Reduced sleep efficiency. - Moderate oxygen desaturation to a nadir of 86.00%. - The patient snored with Loud snoring volume during the diagnostic portion of the study. Snoring ultimately resolved with CPAP at 11  cm water pressure. - No cardiac abnormalities were noted during this study. - Periodic limb movements of sleep occurred during the study with an index of 26.8.  DIAGNOSIS - Obstructive Sleep Apnea (327.23 [G47.33 ICD-10])  RECOMMENDATIONS - Recommend an initial trial of CPAP  therapy with EPR at 11 cm H2O with heated humidification.  A Large size Fisher&Paykel Full Face Mask Simplus mask was used during the study. - Efforts should be made to optimize nasal and oropharyngeal patency. - Due to the positional component efforts should be made to reduce supine sleep. - Avoid alcohol, sedatives and other CNS depressants that may worsen sleep apnea and disrupt normal sleep architecture. - Sleep hygiene should be reviewed to assess factors that may improve sleep quality. - Weight management (BMI 35) and regular exercise should be initiated. - If patient is symptomatic with restless legs on CPAP therapy, consider a trial of medical therapy.  - Recommend a download in 30 days and sleep clinic evaluation.   Troy Sine, MD, [place], American Board of Sleep Medicine  ELECTRONICALLY SIGNED ON:  12/17/2015, 9:19 PM [place] PH: [phone]   FX: [phone] [place]

## 2015-12-19 ENCOUNTER — Ambulatory Visit (HOSPITAL_COMMUNITY)
Admission: RE | Admit: 2015-12-19 | Discharge: 2015-12-19 | Disposition: A | Discharge status: Home / Self Care | Payer: PRIVATE HEALTH INSURANCE | Source: Ambulatory Visit | Attending: Internal Medicine | Admitting: Internal Medicine

## 2015-12-19 DIAGNOSIS — I517 Cardiomegaly: Secondary | ICD-10-CM | POA: Diagnosis not present

## 2015-12-19 DIAGNOSIS — R9431 Abnormal electrocardiogram [ECG] [EKG]: Secondary | ICD-10-CM | POA: Diagnosis not present

## 2015-12-19 DIAGNOSIS — I4940 Unspecified premature depolarization: Secondary | ICD-10-CM | POA: Diagnosis not present

## 2015-12-19 MED ORDER — GADOBENATE DIMEGLUMINE 529 MG/ML IV SOLN
35.0000 mL | Freq: Once | INTRAVENOUS | Status: AC | PRN
  Administered 2015-12-19: 35 mL via INTRAVENOUS

## 2015-12-22 ENCOUNTER — Other Ambulatory Visit (HOSPITAL_COMMUNITY): Payer: PRIVATE HEALTH INSURANCE

## 2015-12-24 ENCOUNTER — Ambulatory Visit (INDEPENDENT_AMBULATORY_CARE_PROVIDER_SITE_OTHER): Payer: PRIVATE HEALTH INSURANCE | Admitting: Internal Medicine

## 2015-12-24 ENCOUNTER — Encounter: Payer: Self-pay | Admitting: Internal Medicine

## 2015-12-24 VITALS — BP 126/92 | HR 76 | Ht 73.0 in | Wt 268.3 lb

## 2015-12-24 DIAGNOSIS — R0789 Other chest pain: Secondary | ICD-10-CM

## 2015-12-24 DIAGNOSIS — I422 Other hypertrophic cardiomyopathy: Secondary | ICD-10-CM

## 2015-12-24 DIAGNOSIS — E7800 Pure hypercholesterolemia, unspecified: Secondary | ICD-10-CM

## 2015-12-24 DIAGNOSIS — I10 Essential (primary) hypertension: Secondary | ICD-10-CM

## 2015-12-24 DIAGNOSIS — N189 Chronic kidney disease, unspecified: Secondary | ICD-10-CM

## 2015-12-24 DIAGNOSIS — I129 Hypertensive chronic kidney disease with stage 1 through stage 4 chronic kidney disease, or unspecified chronic kidney disease: Secondary | ICD-10-CM | POA: Diagnosis not present

## 2015-12-24 DIAGNOSIS — G4733 Obstructive sleep apnea (adult) (pediatric): Secondary | ICD-10-CM

## 2015-12-24 DIAGNOSIS — Z9989 Dependence on other enabling machines and devices: Secondary | ICD-10-CM

## 2015-12-24 NOTE — Progress Notes (Signed)
OFFICE NOTE  Chief Complaint:  Follow-up cMRI  Primary Care Physician: Astrid Divine, MD  HPI:  Theodore White is a 55 year old male patient who is referred to me for evaluation of palpitations and chest pain. He reports about 3 weeks ago while eating a meal he was noted to have a feeling of a wave of lightheadedness go across his head. This was followed by racing of his heart, diaphoresis and some right sided chest pressure. Apparently both of his daughters are nurses and they were able to put her hand on his chest and guesstimated his heart rate was possibly close to 200. This lasted apparently for about 10-15 minutes and then resolve spontaneously. He had a similar episode perhaps about 6 months ago but that did not last but a few minutes. He has not reported any worsening shortness of breath or chest pain with exertion, but is been fairly sedentary since his knee surgery about a year ago. He does have cardiac risk factors including hypertension and reportedly has stage III chronic kidney disease secondary to hypertension, but he is only on a low-dose of benazepril and blood pressure is fairly well-controlled. His creatinine most recently was 1.32 which is not significantly reduced given his age. Nevertheless, he does have an abnormal EKG which shows significant LVH by voltage and T-wave inversions which are deep in leads V3 through V6 as well as 1 and aVL and lead 2. He did fill out a sleep questionnaire which indicate significant chance of dosing with a sleepiness score greater than 10. His wife reports that he is a snorer and has been noted to stop breathing at times. He does feel daytime fatigue and somnolence and has a thick neck.  Theodore White returns today for follow-up. He reports feeling fairly well. He denies any chest pain or shortness of breath. He did undergo a sleep study last evening and felt somewhat tired today. He says that they stop the study part way and fit him with a  mask, but he seemed to be intolerant of it. We do not have a final report but it suggests that he does have some sleep apnea. I encouraged him to be open minded about wearing a CPAP as it may be beneficial for him. His wife reiterated that he is been snoring and having apneic episodes for 20-30 years she's known him. With regards to his testing, he underwent nuclear stress testing as well as an echocardiogram. The results are below:   The left ventricular ejection fraction is normal (55-65%).  Nuclear stress EF: 56%.  There was no ST segment deviation noted during stress.  This is a low risk study.  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  Study Conclusions  - Procedure narrative: Transthoracic echocardiography. Image quality was adequate. The study was technically difficult, as a result of poor acoustic windows. Intravenous contrast (Definity) was administered. - Left ventricle: The cavity size was normal. There was mild concentric hypertrophy. Systolic function was normal. The estimated ejection fraction was in the range of 60% to 65%. Lesinski motion was normal; there were no regional Sura motion abnormalities. Doppler parameters are consistent with abnormal left ventricular relaxation (grade 1 diastolic dysfunction). - Aortic valve: There was no regurgitation. - Aortic root: The aortic root was normal in size. - Ascending aorta: The ascending aorta was normal  in size. - Mitral valve: There was no regurgitation. - Left atrium: The atrium was moderately dilated. - Right ventricle: Systolic function was normal. - Right atrium: The atrium was normal in size. - Tricuspid valve: There was no regurgitation. - Pulmonic valve: There was no regurgitation. - Pulmonary arteries:  Systolic pressure was within the normal range. - Inferior vena cava: The vessel was normal in size. - Pericardium, extracardiac: There was no pericardial effusion.  I discussed the results with him as well as the fact that there are discrepancies. The nuclear stress test suggests there is a fixed anterior Preziosi defect concerning for prior infarct as well as significant apical uptake which may be related to apical hypertrophic cardiomyopathy. Interestingly, the LVEF is normal and the anterior Lecrone motion is normal. The echocardiogram also showed normal anterior Amborn motion without any evidence of scar however the study was limited and required contrast to get decent images. There is certainly discrepancy between both studies with equivocal findings. His EKG showing deep anterior and lateral T-wave inversions could suggest both ischemia, scar or apical hypertrophic cardiomyopathy. Is not certain whether the tests have answer the question of what the cause of his abnormal EKG changes are.  Theodore White returns today for follow-up of his cardiac MRI, the results are below:  EXAM: CARDIAC MRI  TECHNIQUE: The patient was scanned on a 1.5 Tesla GE magnet. A dedicated cardiac coil was used. Functional imaging was done using Fiesta sequences. 2,3, and 4 chamber views were done to assess for RWMA's. Modified Simpson's rule using a short axis stack was used to calculate an ejection fraction on a dedicated work Research officer, trade [employer]. The patient received 35 cc of Multihance. After 10 minutes inversion recovery sequences were used to assess for infiltration and scar tissue.  CONTRAST: 35 cc Multihance  FINDINGS: Limited images of the lung fields showed no significant abnormalities.  Normal left ventricular size and systolic function, EF 61%. There was asymmetric hypertrophy of the apical LV segments and the true apex. Not marked, but there was a clear difference compared to the mid and  basal LV segments. Normal Swiger motion. Normal right ventricular size and systolic function. Mild left atrial enlargement. Normal right atrium. Trileaflet aortic valve with no stenosis or regurgitation. Trivial mitral regurgitation.  On delayed enhancement imaging, there was a small area of late gadolinium enhancement (LGE) at the true apex. Difficult to tell if subendocardial versus mid-Barbee.  MEASUREMENTS: MEASUREMENTS LVEDV 169 mL  LV SV 103  LV EF 61%  IMPRESSION: 1. Normal LV size and systolic function, EF 61%.  2. Pattern of asymmetric hypertrophy involving the LV apex. Not marked, but clear difference from mid and basal Coddington segments.  3. Small area of LGE noted at the apex, hard to tell if subendocardial or mid-Limb.  4. Overall, suggestive of apical variant hypertrophic cardiomyopathy. Given totality of findings, think less likely prior apical MI.  Theodore White   Electronically Signed  By: Marca Ancona M.D.  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 There is a small area gadolinium enhancement which is late at the apex which could be either sub-endocardial or myocardial, but this is not thought to be an ischemic scar. It could be due to some basal cardial necrosis if the muscle has become very thick. We talked about the fact that apical variant hypertrophic cardiomyopathy rarely leads to significant arrhythmias, sudden death or outflow tract obstruction.  In the be reasonable for him to have ongoing monitoring. He should continue his current blood pressure medications which are also helpful as they may provide some benefit in decreasing the risk of worsening cardiomyopathy.  PMHx:  Past Medical History  Diagnosis Date  . Hypertension   . Asthma   . Arthritis     "knees and ankles" (04/18/2014)  . Gout   . Chronic kidney disease     stage 3 renal failure  under control    Past Surgical History  Procedure Laterality Date  . Colonoscopy    . Tooth extraction      "just 2 teeth"  . Knee arthroscopy with medial menisectomy Left 04/17/2014    chondralplasty of patella-femoral joint, extensive synovectomy  . Aspiration biopsy Right 04/17/2014    ankle; "to assess swelling; they weren't able to aspirate anything though"  . Knee arthroscopy Left 04/17/2014    Procedure: ARTHROSCOPY KNEE with medial menisectomy, chondralplasty of patella-femoral joint, extensive synovectomy, aspiration of right ankle;  Surgeon: Harvie Junior, MD;  Location: [facility] OR;  Service: Orthopedics;  Laterality: Left;    FAMHx:  Family History  Problem Relation Age of Onset  . Asthma Mother   . Heart disease Father   . Sleep apnea Father   . Prostate cancer Father   . Healthy Brother   . Drug abuse Brother   . Healthy Brother     SOCHx:   reports that he has never smoked. He has never used smokeless tobacco. He reports that he does not drink alcohol or use illicit drugs.  ALLERGIES:  Allergies  Allergen Reactions  . Penicillins Other (See Comments)    Had med as a child doesn't know the reaction    ROS: A comprehensive review of systems was negative.  HOME MEDS: Current Outpatient Prescriptions  Medication Sig Dispense Refill  . allopurinol (ZYLOPRIM) 300 MG tablet Take 300 mg by mouth daily.    . benazepril (LOTENSIN) 10 MG tablet Take 10 mg by mouth daily.    . Fluticasone-Salmeterol (ADVAIR) 100-50 MCG/DOSE AEPB Inhale 1 puff into the lungs as needed (SOB).     . metoprolol succinate (TOPROL XL) 25 MG 24 hr tablet Take 0.5 tablets (12.5 mg total) by mouth daily. 15 tablet 11  . montelukast (SINGULAIR) 10 MG tablet Take 10 mg by mouth at bedtime.     No current facility-administered medications for this visit.    LABS/IMAGING: No results found for this or any previous visit (from the past 48 hour(s)). No results found.  WEIGHTS: Wt Readings from  Last 3 Encounters:  12/24/15 268 lb 5 oz (121.706 kg)  12/04/15 267 lb 3 oz (121.195 kg)  12/03/15 262 lb (118.842 kg)    VITALS: BP 126/92 mmHg  Pulse 76  Ht 6\' 1"  (1.854 m)  Wt 268 lb 5 oz (121.706 kg)  BMI 35.41 kg/m2  EXAM: Deferred  EKG: Deferred  ASSESSMENT:  Chest pain and tachy-palpitations - low risk stress test  Abnormal EKG - findings on cMRI indicated apical variant HCM - late gadolinium enhancement at the apex  Moderate obesity  OSA  Hypertension  PLAN: 1.   Theodore White has MRI evidence of apical hypertrophic cardiomyopathy. There may be a small area of mesial cardial late gadolinium enhancement. He is not describing active anginal symptoms. His stress test did not suggest significant reversible ischemia. EF is normal. He is on a beta blocker and ACE inhibitor and has well-controlled hypertension. He has been taking  daily aspirin. I think this is a reasonable regimen going forward. He has been diagnosed with severe sleep apnea and hopefully will be fitted with a CPAP machine. He will follow-up with Dr. Tresa Endo for this. Ultimately follow-up with me annually for risk factor modification.  Chrystie Nose, MD, [facility] Attending Cardiologist [facility]  Lisette Abu Deklyn Gibbon 12/24/2015, 9:15 AM

## 2015-12-24 NOTE — Patient Instructions (Signed)
Dr Hilty recommends that you schedule a follow-up appointment in 1 year. You will receive a reminder letter in the mail two months in advance. If you don't receive a letter, please call our office to schedule the follow-up appointment.  If you need a refill on your cardiac medications before your next appointment, please call your pharmacy. 

## 2015-12-25 NOTE — Progress Notes (Signed)
Patient notified of sleep study results and recommendations. CPAP referral sent to [facility].

## 2016-01-06 ENCOUNTER — Encounter: Payer: Self-pay | Admitting: Internal Medicine

## 2016-01-06 NOTE — Telephone Encounter (Signed)
This encounter was created in error - please disregard.

## 2016-01-06 NOTE — Telephone Encounter (Signed)
New message      Calling to see who Dr Rennis Golden referred pt to for cpap machine.  Please call

## 2016-03-08 ENCOUNTER — Ambulatory Visit (INDEPENDENT_AMBULATORY_CARE_PROVIDER_SITE_OTHER): Payer: PRIVATE HEALTH INSURANCE | Admitting: Cardiovascular Disease

## 2016-03-08 ENCOUNTER — Encounter: Payer: Self-pay | Admitting: Cardiovascular Disease

## 2016-03-08 VITALS — BP 130/84 | HR 64 | Ht 73.0 in | Wt 244.0 lb

## 2016-03-08 DIAGNOSIS — G4733 Obstructive sleep apnea (adult) (pediatric): Secondary | ICD-10-CM | POA: Diagnosis not present

## 2016-03-08 DIAGNOSIS — I422 Other hypertrophic cardiomyopathy: Secondary | ICD-10-CM

## 2016-03-08 DIAGNOSIS — R0683 Snoring: Secondary | ICD-10-CM

## 2016-03-08 DIAGNOSIS — Z9989 Dependence on other enabling machines and devices: Secondary | ICD-10-CM

## 2016-03-08 NOTE — Patient Instructions (Signed)
Your physician recommends that you schedule a follow-up appointment AS NEEDED with Dr Tresa Endokelly for sleep.

## 2016-03-10 ENCOUNTER — Encounter: Payer: Self-pay | Admitting: Cardiovascular Disease

## 2016-03-10 NOTE — Progress Notes (Signed)
Patient ID: Theodore White, male   DOB: 1961/01/25, 55 y.o.   MRN: [id number]     HPI: Theodore White is a 55 y.o. male who presents for sleep clinic evaluation following initiation of CPAP therapy.  History Theodore White is a 55 year old male who has a history of probable apical variant hypertrophic heart myopathy.  He has a history of hypertension and obesity and due to symptoms of excessive daytime sleepiness, fatigue, snoring and witnessed apnea.  He was referred for sleep study which was done at the [facility] on 12/03/2015, a split-night study was performed and he was found to have severe obstructive sleep apnea during the diagnostic portion of the study with an AHI of 46.9 per hour.  There was oxygen desaturation to a nadir of 86% and there was evidence for 30 loud snoring.  He ultimately was titrated with CPAP up to 11 cm water pressure with resolution of snoring and with an excellent AHI.  Recurrent periodic limb movements were observed at 138 with a PLMS index of 26.8.  His set up for home therapy was 01/13/2016 and he has an air since 10 AutoSet unit.  Initially, he was set at 11 cm pressure, but he self reduced this down to 8.8 cm.  He has been using a Scientist, clinical (histocompatibility and immunogenetics) fullface mask.  He states he had difficulty with higher pressures leading to his self-regulation. Typically he goes to bed between 11 PM and midnight and wakes up at 6:30 AM.  He feels that he has more energy since initiating CPAP and has less fatigue.  He is unaware of breakthrough snoring.  A download was obtained from [facility] 21 2017 through 02/11/2016.  He is meeting compliance standards with usage stays at 90% and usage with greater than 4 hours per evening at at least 70%.  However, on his set pressure which he reduced himself.  His AHI is still elevated at 16.2, which still places him in the moderate sleep apneic category, although significantly improved from baseline.  An Epworth scale was recalculated today  and this endorsed at 4 and shown below.    Epworth Sleepiness Scale: Situation   Chance of Dozing/Sleeping (0 = never , 1 = slight chance , 2 = moderate chance , 3 = high chance )   sitting and reading 1   watching TV 1   sitting inactive in a public place 0   being a passenger in a motor vehicle for an hour or more 0   lying down in the afternoon 1   sitting and talking to someone 0   sitting quietly after lunch (no alcohol) 1   while stopped for a few minutes in traffic as the driver 0   Total Score  4    Past Medical History  Diagnosis Date  . Hypertension   . Asthma   . Arthritis     "knees and ankles" (04/18/2014)  . Gout   . Chronic kidney disease     stage 3 renal failure under control    Past Surgical History  Procedure Laterality Date  . Colonoscopy    . Tooth extraction      "just 2 teeth"  . Knee arthroscopy with medial menisectomy Left 04/17/2014    chondralplasty of patella-femoral joint, extensive synovectomy  . Aspiration biopsy Right 04/17/2014    ankle; "to assess swelling; they weren't able to aspirate anything though"  . Knee arthroscopy Left 04/17/2014    Procedure: ARTHROSCOPY KNEE  with medial menisectomy, chondralplasty of patella-femoral joint, extensive synovectomy, aspiration of right ankle;  Surgeon: Alta Corning, MD;  Location: [facility];  Service: Orthopedics;  Laterality: Left;    Allergies  Allergen Reactions  . Penicillins Other (See Comments)    Had med as a child doesn't know the reaction    Current Outpatient Prescriptions  Medication Sig Dispense Refill  . allopurinol (ZYLOPRIM) 300 MG tablet Take 300 mg by mouth daily.    . benazepril (LOTENSIN) 10 MG tablet Take 10 mg by mouth daily.    . Fluticasone-Salmeterol (ADVAIR) 100-50 MCG/DOSE AEPB Inhale 1 puff into the lungs as needed (SOB).     . metoprolol succinate (TOPROL XL) 25 MG 24 hr tablet Take 0.5 tablets (12.5 mg total) by mouth daily. 15 tablet 11  . montelukast (SINGULAIR) 10  MG tablet Take 10 mg by mouth at bedtime.     No current facility-administered medications for this visit.    Social History   Social History  . Marital Status: Married    Spouse Name: N/A  . Number of Children: N/A  . Years of Education: N/A   Occupational History  . Not on file.   Social History Main Topics  . Smoking status: Never Smoker   . Smokeless tobacco: Never Used  . Alcohol Use: No  . Drug Use: No  . Sexual Activity: Yes   Other Topics Concern  . Not on file   Social History Narrative    Family History  Problem Relation Age of Onset  . Asthma Mother   . Heart disease Father   . Sleep apnea Father   . Prostate cancer Father   . Healthy Brother   . Drug abuse Brother   . Healthy Brother      ROS General: Negative; No fevers, chills, or night sweats HEENT: Negative; No changes in vision or hearing, sinus congestion, difficulty swallowing Pulmonary: Negative; No cough, wheezing, shortness of breath, hemoptysis Cardiovascular: Negative; No chest pain, presyncope, syncope, palpatations GI: Negative; No nausea, vomiting, diarrhea, or abdominal pain GU: Negative; No dysuria, hematuria, or difficulty voiding Musculoskeletal: Negative; no myalgias, joint pain, or weakness Hematologic: Negative; no easy bruising, bleeding Endocrine: Negative; no heat/cold intolerance Neuro: Negative; no changes in balance, headaches Skin: Negative; No rashes or skin lesions Psychiatric: Negative; No behavioral problems, depression Sleep: Positive for obstructive sleep apnea, now on CPAP therapy; he denies painful restless legs, but does note leg movement;  No daytime sleepiness, hypersomnolence, bruxism, hypnogognic hallucinations, no cataplexy   Physical Exam BP 130/84 mmHg  Pulse 64  Ht _0  (1.854 m)  Wt 244 lb (110.678 kg)  BMI 32.20 kg/m2  Wt Readings from Last 3 Encounters:  03/08/16 244 lb (110.678 kg)  12/24/15 268 lb 5 oz (121.706 kg)  12/04/15 267 lb 3 oz  (121.195 kg)   General: Alert, oriented, no distress.  Skin: normal turgor, no rashes HEENT: Normocephalic, atraumatic. Pupils round and reactive; sclera anicteric; extraocular muscles intact; Fundi without hemorrhages or exudates Nose without nasal septal hypertrophy Mouth/Parynx benign; Mallinpatti scale 2 Neck: No JVD, no carotid bruits Lungs: clear to ausculatation and percussion; no wheezing or rales  Chest Chern: No tenderness to palpation Heart: RRR, s1 s2 normal; 1/6 systolic murmur. Abdomen: soft, nontender; no hepatosplenomehaly, BS+; abdominal aorta nontender and not dilated by palpation. Back: No CVA tenderness Pulses 2+ Extremities: no clubbinbg cyanosis or edema, Homan's sign negative  Neurologic: grossly nonfocal; cranial nerves intact. Psychological: Normal affect and mood.  ECG (independently read by me): Not done today  LABS:  BMP Latest Ref Rng 12/04/2015 04/17/2014  Glucose 65 - 99 mg/dL 107(H) 123(H)  BUN 6 - 24 mg/dL 14 23  Creatinine 0.76 - 1.27 mg/dL 1.18 1.29  BUN/Creat Ratio 9 - 20 12 -  Sodium 134 - 144 mmol/L 138 137  Potassium 3.5 - 5.2 mmol/L 4.7 4.7  Chloride 96 - 106 mmol/L 103 100  CO2 18 - 29 mmol/L 28 20  Calcium 8.7 - 10.2 mg/dL 9.5 10.1     Hepatic Function Latest Ref Rng 04/17/2014  Total Protein 6.0 - 8.3 g/dL 8.3  Albumin 3.5 - 5.2 g/dL 4.0  AST 0 - 37 U/L 20  ALT 0 - 53 U/L 19  Alk Phosphatase 39 - 117 U/L 81  Total Bilirubin 0.3 - 1.2 mg/dL 0.7     CBC Latest Ref Rng 04/18/2014 04/17/2014  WBC 4.0 - 10.5 K/uL 14.4(H) 14.9(H)  Hemoglobin 13.0 - 17.0 g/dL 12.5(L) 14.8  Hematocrit 39.0 - 52.0 % 37.6(L) 43.1  Platelets 150 - 400 K/uL 218 245     Lipid Panel  No results found for: CHOL, TRIG, HDL, CHOLHDL, VLDL, LDLCALC, LDLDIRECT   RADIOLOGY: No results found.    ASSESSMENT AND PLAN: Mr. Theodore White has a history of apical variant hypertrophic cardiomyopathy and hypertension.  His body mass index is 32.2, placing him in the obese  category.  He was found to have severe obstructive sleep apnea on his sleep study and is meeting compliance standards relative to CPAP use.  Since initiation of CPAP therapy he has felt partially improved  Dduring his sleep study he was titrated up to 11 cm water pressure which was necessary for resolution of symptoms.  He had self reduced his pressure to 8.8 cm due to feeling that the pressure was too high and he had some mask leak.  His current download reveals that he still has moderate obstructive sleep apnea with an AHI of 16.2/h.  He is sleeping for adequate duration.  I am changing him to an auto mode range from 8-15 cm water pressure.  Hopefully, this will allow him to fall sleep and while sleeping if higher pressure is needed auto titration up to 15 cm water pressure can occur.Marland Kitchen  He also admits to leg movements but denies painful restless legs.  We discussed the importance of weight loss.  I reviewed sleep architecture and the adverse consequences of sleep apnea on sleep architecture as well as cardiovascular morbidity.  Of note, on his diagnostic portion of his split-night protocol, he was only to have 9% REM sleep, but with CPAP therapy REM sleep was 26.24%.  I answered all his questions.  We will obtain a new download in approximately one month following changing him to an auto mode.  Adjustments will be made if necessary.  As long as he remains stable I will see him on an as-needed basis from a sleep perspective.   Troy Sine, MD, [facility]  03/10/2016 7:38 PM

## 2016-03-11 ENCOUNTER — Encounter: Payer: Self-pay | Admitting: Internal Medicine

## 2016-12-27 ENCOUNTER — Encounter: Payer: Self-pay | Admitting: Internal Medicine

## 2016-12-27 ENCOUNTER — Ambulatory Visit (INDEPENDENT_AMBULATORY_CARE_PROVIDER_SITE_OTHER): Payer: PRIVATE HEALTH INSURANCE | Admitting: Internal Medicine

## 2016-12-27 VITALS — BP 115/68 | HR 76 | Ht 73.0 in | Wt 213.6 lb

## 2016-12-27 DIAGNOSIS — I10 Essential (primary) hypertension: Secondary | ICD-10-CM

## 2016-12-27 DIAGNOSIS — Z9989 Dependence on other enabling machines and devices: Secondary | ICD-10-CM | POA: Diagnosis not present

## 2016-12-27 DIAGNOSIS — G4733 Obstructive sleep apnea (adult) (pediatric): Secondary | ICD-10-CM

## 2016-12-27 DIAGNOSIS — I422 Other hypertrophic cardiomyopathy: Secondary | ICD-10-CM

## 2016-12-27 NOTE — Progress Notes (Signed)
OFFICE NOTE  Chief Complaint:  No complaints  Primary Care Physician: Astrid DivineGRIFFIN,ELAINE COLLINS, MD  HPI:  Theodore White is a 56 year old male patient who is referred to me for evaluation of palpitations and chest pain. He reports about 3 weeks ago while eating a meal he was noted to have a feeling of a wave of lightheadedness go across his head. This was followed by racing of his heart, diaphoresis and some right sided chest pressure. Apparently both of his daughters are nurses and they were able to put her hand on his chest and guesstimated his heart rate was possibly close to 200. This lasted apparently for about 10-15 minutes and then resolve spontaneously. He had a similar episode perhaps about 6 months ago but that did not last but a few minutes. He has not reported any worsening shortness of breath or chest pain with exertion, but is been fairly sedentary since his knee surgery about a year ago. He does have cardiac risk factors including hypertension and reportedly has stage III chronic kidney disease secondary to hypertension, but he is only on a low-dose of benazepril and blood pressure is fairly well-controlled. His creatinine most recently was 1.32 which is not significantly reduced given his age. Nevertheless, he does have an abnormal EKG which shows significant LVH by voltage and T-wave inversions which are deep in leads V3 through V6 as well as 1 and aVL and lead 2. He did fill out a sleep questionnaire which indicate significant chance of dosing with a sleepiness score greater than 10. His wife reports that he is a snorer and has been noted to stop breathing at times. He does feel daytime fatigue and somnolence and has a thick neck.  Theodore White returns today for follow-up. He reports feeling fairly well. He denies any chest pain or shortness of breath. He did undergo a sleep study last evening and felt somewhat tired today. He says that they stop the study part way and fit him with a mask,  but he seemed to be intolerant of it. We do not have a final report but it suggests that he does have some sleep apnea. I encouraged him to be open minded about wearing a CPAP as it may be beneficial for him. His wife reiterated that he is been snoring and having apneic episodes for 20-30 years she's known him. With regards to his testing, he underwent nuclear stress testing as well as an echocardiogram. The results are below:   The left ventricular ejection fraction is normal (55-65%).  Nuclear stress EF: 56%.  There was no ST segment deviation noted during stress.  This is a low risk study.  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  Study Conclusions  - Procedure narrative: Transthoracic echocardiography. Image quality was adequate. The study was technically difficult, as a result of poor acoustic windows. Intravenous contrast (Definity) was administered. - Left ventricle: The cavity size was normal. There was mild concentric hypertrophy. Systolic function was normal. The estimated ejection fraction was in the range of 60% to 65%. Quade motion was normal; there were no regional Zoss motion abnormalities. Doppler parameters are consistent with abnormal left ventricular relaxation (grade 1 diastolic dysfunction). - Aortic valve: There was no regurgitation. - Aortic root: The aortic root was normal in size. - Ascending aorta: The ascending aorta was normal  in size. - Mitral valve: There was no regurgitation. - Left atrium: The atrium was moderately dilated. - Right ventricle: Systolic function was normal. - Right atrium: The atrium was normal in size. - Tricuspid valve: There was no regurgitation. - Pulmonic valve: There was no regurgitation. - Pulmonary arteries: Systolic  pressure was within the normal range. - Inferior vena cava: The vessel was normal in size. - Pericardium, extracardiac: There was no pericardial effusion.  I discussed the results with him as well as the fact that there are discrepancies. The nuclear stress test suggests there is a fixed anterior Bob defect concerning for prior infarct as well as significant apical uptake which may be related to apical hypertrophic cardiomyopathy. Interestingly, the LVEF is normal and the anterior Trettin motion is normal. The echocardiogram also showed normal anterior Zeidan motion without any evidence of scar however the study was limited and required contrast to get decent images. There is certainly discrepancy between both studies with equivocal findings. His EKG showing deep anterior and lateral T-wave inversions could suggest both ischemia, scar or apical hypertrophic cardiomyopathy. Is not certain whether the tests have answer the question of what the cause of his abnormal EKG changes are.  Theodore White returns today for follow-up of his cardiac MRI, the results are below:  EXAM: CARDIAC MRI  TECHNIQUE: The patient was scanned on a 1.5 Tesla GE magnet. A dedicated cardiac coil was used. Functional imaging was done using Fiesta sequences. 2,3, and 4 chamber views were done to assess for RWMA's. Modified Simpson's rule using a short axis stack was used to calculate an ejection fraction on a dedicated work [employer]. The patient received 35 cc of Multihance. After 10 minutes inversion recovery sequences were used to assess for infiltration and scar tissue.  CONTRAST: 35 cc Multihance  FINDINGS: Limited images of the lung fields showed no significant abnormalities.  Normal left ventricular size and systolic function, EF 61%. There was asymmetric hypertrophy of the apical LV segments and the true apex. Not marked, but there was a clear difference compared to the mid and basal LV  segments. Normal Jaffer motion. Normal right ventricular size and systolic function. Mild left atrial enlargement. Normal right atrium. Trileaflet aortic valve with no stenosis or regurgitation. Trivial mitral regurgitation.  On delayed enhancement imaging, there was a small area of late gadolinium enhancement (LGE) at the true apex. Difficult to tell if subendocardial versus mid-[place].  MEASUREMENTS: MEASUREMENTS LVEDV 169 mL  LV SV 103  LV EF 61%  IMPRESSION: 1. Normal LV size and systolic function, EF 61%.  2. Pattern of asymmetric hypertrophy involving the LV apex. Not marked, but clear difference from mid and basal Vezina segments.  3. Small area of LGE noted at the apex, hard to tell if subendocardial or mid-[place].  4. Overall, suggestive of apical variant hypertrophic cardiomyopathy. Given totality of findings, think less likely prior apical MI.  Dalton Mclean   Electronically Signed  By: Marca Ancona M.D.  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 There is a small area gadolinium enhancement which is late at the apex which could be either sub-endocardial or myocardial, but this is not thought to be an ischemic scar. It could be due to some basal cardial necrosis if the muscle has become very thick. We talked about the fact that apical variant hypertrophic cardiomyopathy rarely leads to significant arrhythmias, sudden death or outflow tract obstruction.  In the be reasonable for him to have ongoing monitoring. He should continue his current blood pressure medications which are also helpful as they may provide some benefit in decreasing the risk of worsening cardiomyopathy.  12/27/2016  Theodore White was seen today in follow-up. He has done exceedingly well. I commended him over significant weight loss. He's had 55 pound weight loss over the past year. This is been  significantly impact fullness health. His blood pressure had become fairly low with low pulse rate and his beta blocker was discontinued. He remains on low-dose benazepril. He denies any tachycardia palpitations, chest pain or worsening shortness of breath with exertion. His last echo was in 2016 and cardiac MRI in 2017.  PMHx:  Past Medical History:  Diagnosis Date  . Arthritis    "knees and ankles" (04/18/2014)  . Asthma   . Chronic kidney disease    stage 3 renal failure under control  . Gout   . Hypertension     Past Surgical History:  Procedure Laterality Date  . ASPIRATION BIOPSY Right 04/17/2014   ankle; "to assess swelling; they weren't able to aspirate anything though"  . COLONOSCOPY    . KNEE ARTHROSCOPY Left 04/17/2014   Procedure: ARTHROSCOPY KNEE with medial menisectomy, chondralplasty of patella-femoral joint, extensive synovectomy, aspiration of right ankle;  Surgeon: Harvie Junior, MD;  Location: [facility] OR;  Service: Orthopedics;  Laterality: Left;  . KNEE ARTHROSCOPY WITH MEDIAL MENISECTOMY Left 04/17/2014   chondralplasty of patella-femoral joint, extensive synovectomy  . TOOTH EXTRACTION     "just 2 teeth"    FAMHx:  Family History  Problem Relation Age of Onset  . Asthma Mother   . Heart disease Father   . Sleep apnea Father   . Prostate cancer Father   . Healthy Brother   . Drug abuse Brother   . Healthy Brother     SOCHx:   reports that he has never smoked. He has never used smokeless tobacco. He reports that he does not drink alcohol or use drugs.  ALLERGIES:  Allergies  Allergen Reactions  . Penicillins Other (See Comments)    Had med as a child doesn't know the reaction    ROS: Pertinent items noted in HPI and remainder of comprehensive ROS otherwise negative.  HOME MEDS: Current Outpatient Prescriptions  Medication Sig Dispense Refill  . allopurinol (ZYLOPRIM) 300 MG tablet Take 300 mg by mouth daily.    . benazepril (LOTENSIN) 10 MG tablet  Take 10 mg by mouth daily.    . Fluticasone-Salmeterol (ADVAIR) 100-50 MCG/DOSE AEPB Inhale 1 puff into the lungs as needed (SOB).     . montelukast (SINGULAIR) 10 MG tablet Take 10 mg by mouth at bedtime.     No current facility-administered medications for this visit.     LABS/IMAGING: No results found for this or any previous visit (from the past 48 hour(s)). No results found.  WEIGHTS: Wt Readings from Last 3 Encounters:  12/27/16 213 lb 9.6 oz (96.9 kg)  03/08/16 244 lb (110.7 kg)  12/24/15 268 lb 5 oz (121.7 kg)    VITALS: BP 115/68 (BP Location: Right Arm)   Pulse 76   Ht 6\' 1"  (1.854 m)   Wt 213 lb 9.6 oz (96.9 kg)   BMI 28.18 kg/m   EXAM: General appearance: alert and no distress Neck: no carotid bruit and no JVD Lungs: clear to auscultation bilaterally Heart: regular rate and rhythm, S1, S2 normal, no murmur, click, rub or gallop Abdomen:  soft, non-tender; bowel sounds normal; no masses,  no organomegaly Extremities: extremities normal, atraumatic, no cyanosis or edema Pulses: 2+ and symmetric Skin: Skin color, texture, turgor normal. No rashes or lesions Neurologic: Grossly normal Psych: Pleasant  EKG: Normal sinus rhythm at 74, anterolateral ST and T-wave inversions consistent with apical hypertrophic artery myopathy  ASSESSMENT:  Chest pain and tachy-palpitations - resolved (low risk myoview 2016)  Abnormal EKG - findings on cMRI indicated apical variant HCM - late gadolinium enhancement at the apex (2017)  Mildly overweight (recent 55 lb weight loss)  OSA  Hypertension  PLAN: 1.   Theodore White has had significant weight loss over the past year 55 pounds. It may be that he no longer has any sleep apnea. Blood pressure is very well controlled. His beta blocker was discontinued due to bradycardia and hypotension. He has no further palpitations. He will be due for repeat echo in one year which is 3 years since his last study. 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 He should be at low risk for any cardiovascular events.  Follow-up annually or sooner as necessary.  Chrystie Nose, MD, [facility] Attending Cardiologist [facility]  Chrystie Nose 12/27/2016, 8:46 AM

## 2016-12-27 NOTE — Patient Instructions (Addendum)
Your physician wants you to follow-up in: ONE YEAR with Dr. Hilty after echocardiogram. You will receive a reminder letter in the mail two months in advance. If you don't receive a letter, please call our office to schedule the follow-up appointment.  

## 2017-11-29 ENCOUNTER — Other Ambulatory Visit: Payer: Self-pay

## 2017-11-29 ENCOUNTER — Ambulatory Visit (HOSPITAL_COMMUNITY): Payer: PRIVATE HEALTH INSURANCE | Attending: Cardiology

## 2017-11-29 DIAGNOSIS — I429 Cardiomyopathy, unspecified: Secondary | ICD-10-CM | POA: Diagnosis present

## 2017-11-29 DIAGNOSIS — N189 Chronic kidney disease, unspecified: Secondary | ICD-10-CM | POA: Insufficient documentation

## 2017-11-29 DIAGNOSIS — I129 Hypertensive chronic kidney disease with stage 1 through stage 4 chronic kidney disease, or unspecified chronic kidney disease: Secondary | ICD-10-CM | POA: Diagnosis not present

## 2017-11-29 DIAGNOSIS — I422 Other hypertrophic cardiomyopathy: Secondary | ICD-10-CM | POA: Diagnosis not present

## 2017-12-01 MED ORDER — PERFLUTREN LIPID MICROSPHERE
1.0000 mL | INTRAVENOUS | Status: AC | PRN
  Administered 2017-12-01: 2 mL via INTRAVENOUS

## 2018-02-28 ENCOUNTER — Other Ambulatory Visit: Payer: Self-pay | Admitting: Internal Medicine

## 2018-03-15 ENCOUNTER — Encounter: Payer: Self-pay | Admitting: Internal Medicine

## 2018-03-15 ENCOUNTER — Ambulatory Visit (INDEPENDENT_AMBULATORY_CARE_PROVIDER_SITE_OTHER): Payer: PRIVATE HEALTH INSURANCE | Admitting: Internal Medicine

## 2018-03-15 VITALS — BP 122/78 | HR 72 | Ht 73.0 in | Wt 240.0 lb

## 2018-03-15 DIAGNOSIS — Z9989 Dependence on other enabling machines and devices: Secondary | ICD-10-CM | POA: Diagnosis not present

## 2018-03-15 DIAGNOSIS — G4733 Obstructive sleep apnea (adult) (pediatric): Secondary | ICD-10-CM | POA: Diagnosis not present

## 2018-03-15 DIAGNOSIS — I10 Essential (primary) hypertension: Secondary | ICD-10-CM | POA: Diagnosis not present

## 2018-03-15 DIAGNOSIS — I422 Other hypertrophic cardiomyopathy: Secondary | ICD-10-CM | POA: Diagnosis not present

## 2018-03-15 NOTE — Patient Instructions (Signed)
Your physician wants you to follow-up in: ONE YEAR with Dr. Hilty. You will receive a reminder letter in the mail two months in advance. If you don't receive a letter, please call our office to schedule the follow-up appointment.  

## 2018-03-15 NOTE — Progress Notes (Signed)
OFFICE NOTE  Chief Complaint:  No complaints  Primary Care Physician: Maurice Small, MD  HPI:  Theodore White is a 57 year old male patient who is referred to me for evaluation of palpitations and chest pain. He reports about 3 weeks ago while eating a meal he was noted to have a feeling of a wave of lightheadedness go across his head. This was followed by racing of his heart, diaphoresis and some right sided chest pressure. Apparently both of his daughters are nurses and they were able to put her hand on his chest and guesstimated his heart rate was possibly close to 200. This lasted apparently for about 10-15 minutes and then resolve spontaneously. He had a similar episode perhaps about 6 months ago but that did not last but a few minutes. He has not reported any worsening shortness of breath or chest pain with exertion, but is been fairly sedentary since his knee surgery about a year ago. He does have cardiac risk factors including hypertension and reportedly has stage III chronic kidney disease secondary to hypertension, but he is only on a low-dose of benazepril and blood pressure is fairly well-controlled. His creatinine most recently was 1.32 which is not significantly reduced given his age. Nevertheless, he does have an abnormal EKG which shows significant LVH by voltage and T-wave inversions which are deep in leads V3 through V6 as well as 1 and aVL and lead 2. He did fill out a sleep questionnaire which indicate significant chance of dosing with a sleepiness score greater than 10. His wife reports that he is a snorer and has been noted to stop breathing at times. He does feel daytime fatigue and somnolence and has a thick neck.  Theodore White returns today for follow-up. He reports feeling fairly well. He denies any chest pain or shortness of breath. He did undergo a sleep study last evening and felt somewhat tired today. He says that they stop the study part way and fit him with a mask, but he  seemed to be intolerant of it. We do not have a final report but it suggests that he does have some sleep apnea. I encouraged him to be open minded about wearing a CPAP as it may be beneficial for him. His wife reiterated that he is been snoring and having apneic episodes for 20-30 years she's known him. With regards to his testing, he underwent nuclear stress testing as well as an echocardiogram. The results are below:   The left ventricular ejection fraction is normal (55-65%).  Nuclear stress EF: 56%.  There was no ST segment deviation noted during stress.  This is a low risk study.  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  Study Conclusions  - Procedure narrative: Transthoracic echocardiography. Image quality was adequate. The study was technically difficult, as a result of poor acoustic windows. Intravenous contrast (Definity) was administered. - Left ventricle: The cavity size was normal. There was mild concentric hypertrophy. Systolic function was normal. The estimated ejection fraction was in the range of 60% to 65%. Sprenkle motion was normal; there were no regional Tinoco motion abnormalities. Doppler parameters are consistent with abnormal left ventricular relaxation (grade 1 diastolic dysfunction). - Aortic valve: There was no regurgitation. - Aortic root: The aortic root was normal in size. - Ascending aorta: The ascending aorta was normal  in size. - Mitral valve: There was no regurgitation. - Left atrium: The atrium was moderately dilated. - Right ventricle: Systolic function was normal. - Right atrium: The atrium was normal in size. - Tricuspid valve: There was no regurgitation. - Pulmonic valve: There was no regurgitation. - Pulmonary arteries: Systolic  pressure was within the normal range. - Inferior vena cava: The vessel was normal in size. - Pericardium, extracardiac: There was no pericardial effusion.  I discussed the results with him as well as the fact that there are discrepancies. The nuclear stress test suggests there is a fixed anterior Marland defect concerning for prior infarct as well as significant apical uptake which may be related to apical hypertrophic cardiomyopathy. Interestingly, the LVEF is normal and the anterior Retter motion is normal. The echocardiogram also showed normal anterior Hillenburg motion without any evidence of scar however the study was limited and required contrast to get decent images. There is certainly discrepancy between both studies with equivocal findings. His EKG showing deep anterior and lateral T-wave inversions could suggest both ischemia, scar or apical hypertrophic cardiomyopathy. Is not certain whether the tests have answer the question of what the cause of his abnormal EKG changes are.  Theodore White returns today for follow-up of his cardiac MRI, the results are below:  EXAM: CARDIAC MRI  TECHNIQUE: The patient was scanned on a 1.5 Tesla GE magnet. A dedicated cardiac coil was used. Functional imaging was done using Fiesta sequences. 2,3, and 4 chamber views were done to assess for RWMA's. Modified Simpson's rule using a short axis stack was used to calculate an ejection fraction on a dedicated work [employer]. The patient received 35 cc of Multihance. After 10 minutes inversion recovery sequences were used to assess for infiltration and scar tissue.  CONTRAST: 35 cc Multihance  FINDINGS: Limited images of the lung fields showed no significant abnormalities.  Normal left ventricular size and systolic function, EF 61%. There was asymmetric hypertrophy of the apical LV segments and the true apex. Not marked, but there was a clear difference compared to the mid and basal LV  segments. Normal Galicia motion. Normal right ventricular size and systolic function. Mild left atrial enlargement. Normal right atrium. Trileaflet aortic valve with no stenosis or regurgitation. Trivial mitral regurgitation.  On delayed enhancement imaging, there was a small area of late gadolinium enhancement (LGE) at the true apex. Difficult to tell if subendocardial versus mid-Chai.  MEASUREMENTS: MEASUREMENTS LVEDV 169 mL  LV SV 103  LV EF 61%  IMPRESSION: 1. Normal LV size and systolic function, EF 61%.  2. Pattern of asymmetric hypertrophy involving the LV apex. Not marked, but clear difference from mid and basal Stcyr segments.  3. Small area of LGE noted at the apex, hard to tell if subendocardial or mid-Popelka.  4. Overall, suggestive of apical variant hypertrophic cardiomyopathy. Given totality of findings, think less likely prior apical MI.  Dalton Mclean   Electronically Signed  By: Marca Ancona M.D.  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 There is a small area gadolinium enhancement which is late at the apex which could be either sub-endocardial or myocardial, but this is not thought to be an ischemic scar. It could be due to some basal cardial necrosis if the muscle has become very thick. We talked about the fact that apical variant hypertrophic cardiomyopathy rarely leads to significant arrhythmias, sudden death or outflow tract obstruction.  In the be reasonable for him to have ongoing monitoring. He should continue his current blood pressure medications which are also helpful as they may provide some benefit in decreasing the risk of worsening cardiomyopathy.  12/27/2016  Theodore White was seen today in follow-up. He has done exceedingly well. I commended him over significant weight loss. He's had 55 pound weight loss over the past year. This is been  significantly impact fullness health. His blood pressure had become fairly low with low pulse rate and his beta blocker was discontinued. He remains on low-dose benazepril. He denies any tachycardia palpitations, chest pain or worsening shortness of breath with exertion. His last echo was in 2016 and cardiac MRI in 2017.  03/15/2018  Theodore White was seen today in follow-up.  He continues to do well.  He had significant weight loss last year unfortunately he is gained the weight back.  He is now 240 pounds.  He denies any chest pain or worsening shortness of breath.  He is on low-dose benazepril and blood pressure is well controlled.  He has no palpitations.  Cardiac MRI was in 2017.  This confirmed the diagnosis of apical variant hypertrophic cardiomyopathy.  Labs from April 2018 showed total cholesterol 169, HDL 46, LDL 100 and triglycerides 161113.  PMHx:  Past Medical History:  Diagnosis Date  . Arthritis    "knees and ankles" (04/18/2014)  . Asthma   . Chronic kidney disease    stage 3 renal failure under control  . Gout   . Hypertension     Past Surgical History:  Procedure Laterality Date  . ASPIRATION BIOPSY Right 04/17/2014   ankle; "to assess swelling; they weren't able to aspirate anything though"  . COLONOSCOPY    . KNEE ARTHROSCOPY Left 04/17/2014   Procedure: ARTHROSCOPY KNEE with medial menisectomy, chondralplasty of patella-femoral joint, extensive synovectomy, aspiration of right ankle;  Surgeon: Harvie JuniorJohn L Graves, MD;  Location: [facility] OR;  Service: Orthopedics;  Laterality: Left;  . KNEE ARTHROSCOPY WITH MEDIAL MENISECTOMY Left 04/17/2014   chondralplasty of patella-femoral joint, extensive synovectomy  . TOOTH EXTRACTION     "just 2 teeth"    FAMHx:  Family History  Problem Relation Age of Onset  . Asthma Mother   . Heart disease Father   . Sleep apnea Father   . Prostate cancer Father   . Healthy Brother   . Drug abuse Brother   . Healthy Brother     SOCHx:   reports that  he has never smoked. He has never used smokeless tobacco. He reports that he does not drink alcohol or use drugs.  ALLERGIES:  Allergies  Allergen Reactions  . Penicillins Other (See Comments)    Had med as a child doesn't know the reaction    ROS: Pertinent items noted in HPI and remainder of comprehensive ROS otherwise negative.  HOME MEDS: Current Outpatient Medications  Medication Sig Dispense Refill  . allopurinol (ZYLOPRIM) 100 MG tablet TAKE 3 TABLETS (300MG  TOTAL) BY MOUTH EVERY DAY 90 tablet 8  . allopurinol (ZYLOPRIM) 300 MG tablet Take 300 mg by mouth daily.    . benazepril (LOTENSIN) 10 MG tablet Take 10 mg by mouth daily.    . Fluticasone-Salmeterol (ADVAIR) 100-50 MCG/DOSE AEPB Inhale 1 puff into the lungs as needed (SOB).     . montelukast (SINGULAIR) 10 MG tablet Take 10 mg by mouth at bedtime.     No current facility-administered medications for this visit.     LABS/IMAGING: No results  found for this or any previous visit (from the past 48 hour(s)). No results found.  WEIGHTS: Wt Readings from Last 3 Encounters:  03/15/18 240 lb (108.9 kg)  12/27/16 213 lb 9.6 oz (96.9 kg)  03/08/16 244 lb (110.7 kg)    VITALS: BP 122/78   Pulse 72   Ht 6\' 1"  (1.854 m)   Wt 240 lb (108.9 kg)   BMI 31.66 kg/m   EXAM: General appearance: alert and no distress Neck: no carotid bruit and no JVD Lungs: clear to auscultation bilaterally Heart: regular rate and rhythm, S1, S2 normal, no murmur, click, rub or gallop Abdomen: soft, non-tender; bowel sounds normal; no masses,  no organomegaly Extremities: extremities normal, atraumatic, no cyanosis or edema Pulses: 2+ and symmetric Skin: Skin color, texture, turgor normal. No rashes or lesions Neurologic: Grossly normal Psych: Pleasant  EKG: Sinus rhythm with inferior and lateral deep T wave inversions at 72-suggestive of apical variant hypertrophic cardiomyopathy, personally reviewed  ASSESSMENT: 1. Chest pain and  tachy-palpitations - resolved (low risk myoview 2016) 2. Abnormal EKG - findings on cMRI indicated apical variant HCM - late gadolinium enhancement at the apex (2017) 3. Mildly overweight (recent 55 lb weight loss) 4. OSA 5. Hypertension  PLAN: 1.   Mr. Sinclair had had significant weight loss unfortunately gained a lot back over the past year.  He needs to get back on his previous regimen.  His blood pressure is well controlled.  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  This appears to be stable.  He is asymptomatic with it.  I provided with a copy of his EKG today to hold onto in case he were somewhere and had some chest discomfort to have a baseline.  We will plan to see him back in follow-up clinically annually or sooner as necessary.  Chrystie Nose, MD, [facility], FACP  [place]  [facility]  Medical Director of the Advanced Lipid Disorders &  Cardiovascular Risk Reduction Clinic Diplomate of the American Board of Clinical Lipidology Attending Cardiologist  Direct Dial: [phone]  Fax: [phone]  Website:  [email or web address]  Lisette Abu Hilty 03/15/2018, 3:24 PM

## 2018-11-27 ENCOUNTER — Other Ambulatory Visit: Payer: Self-pay | Admitting: Internal Medicine

## 2019-08-31 ENCOUNTER — Telehealth: Payer: Self-pay | Admitting: Internal Medicine

## 2019-08-31 ENCOUNTER — Other Ambulatory Visit: Payer: Self-pay | Admitting: Internal Medicine

## 2019-08-31 NOTE — Telephone Encounter (Signed)
Pt overdue for 12 month f/u. °Please contact pt for future appointment. °

## 2019-08-31 NOTE — Telephone Encounter (Signed)
Please see note below. 

## 2019-08-31 NOTE — Telephone Encounter (Signed)
LVM for patient to call and schedule 1 yr followup with Dr. Debara Pickett.

## 2019-09-27 ENCOUNTER — Other Ambulatory Visit: Payer: Self-pay | Admitting: Internal Medicine

## 2019-09-30 ENCOUNTER — Other Ambulatory Visit: Payer: Self-pay | Admitting: Internal Medicine

## 2019-11-01 ENCOUNTER — Other Ambulatory Visit: Payer: Self-pay | Admitting: Internal Medicine

## 2019-11-27 ENCOUNTER — Other Ambulatory Visit: Payer: Self-pay | Admitting: Internal Medicine

## 2020-01-01 ENCOUNTER — Other Ambulatory Visit: Payer: Self-pay | Admitting: Internal Medicine

## 2020-03-07 ENCOUNTER — Other Ambulatory Visit: Payer: Self-pay | Admitting: Physician Assistant

## 2020-03-07 DIAGNOSIS — N5089 Other specified disorders of the male genital organs: Secondary | ICD-10-CM

## 2020-03-12 ENCOUNTER — Ambulatory Visit
Admission: RE | Admit: 2020-03-12 | Discharge: 2020-03-12 | Disposition: A | Discharge status: Home / Self Care | Payer: PRIVATE HEALTH INSURANCE | Source: Ambulatory Visit | Attending: Physician Assistant | Admitting: Physician Assistant

## 2020-03-12 DIAGNOSIS — N5089 Other specified disorders of the male genital organs: Secondary | ICD-10-CM

## 2020-09-12 NOTE — Progress Notes (Signed)
Cardiology Clinic Note   Patient Name: Theodore White Date of Encounter: 09/15/2020  Primary Care Provider:  Maurice Small, MD Primary Cardiologist:  Theodore Nose, MD  Patient Profile    Theodore White 59 year old male presents to the clinic today for an evaluation essential hypertension and atypical hypertrophic cardiomyopathy.  Past Medical History    Past Medical History:  Diagnosis Date  . Arthritis    "knees and ankles" (04/18/2014)  . Asthma   . Chronic kidney disease    stage 3 renal failure under control  . Gout   . Hypertension    Past Surgical History:  Procedure Laterality Date  . ASPIRATION BIOPSY Right 04/17/2014   ankle; "to assess swelling; they weren't able to aspirate anything though"  . COLONOSCOPY    . KNEE ARTHROSCOPY Left 04/17/2014   Procedure: ARTHROSCOPY KNEE with medial menisectomy, chondralplasty of patella-femoral joint, extensive synovectomy, aspiration of right ankle;  Surgeon: Theodore Junior, MD;  Location: [facility] OR;  Service: Orthopedics;  Laterality: Left;  . KNEE ARTHROSCOPY WITH MEDIAL MENISECTOMY Left 04/17/2014   chondralplasty of patella-femoral joint, extensive synovectomy  . TOOTH EXTRACTION     "just 2 teeth"    Allergies  Allergies  Allergen Reactions  . Penicillins Other (See Comments)    Had med as a child doesn't know the reaction    History of Present Illness    Theodore White has a PMH of essential hypertension atypical variant hypertrophic cardiomyopathy, OSA on CPAP, CKD stage III, and hypercholesterolemia.  He was initially referred to Dr. Rennis White for an evaluation of his palpitations and chest pain.  He had reported a feeling of lightheadedness 3 weeks prior to his initial visit.  The event was followed by an episode of heart racing, diaphoresis and right-sided pressure.  During the episode his daughters who are nurses felt his heart rate was close to 200.  The episode lasted 10 to 15 minutes and resolve spontaneously.  His EKG  showed significant LVH by voltage and T wave inversions in leads V3 through V6 as well as 1 and aVL.  A stress test at that time showed an EF of 55-65% no significant deviation and was low risk.  Echocardiogram showed LVEF 60-65% G1 DD, and no other significant abnormalities.  He underwent cardiac MRI which showed an EF of 61% asymmetric hypertrophy of the apical LV segments and the true apex.  Overall suggestive of apical variant hypertrophic cardiomyopathy.  Not felt to have prior MI.  He was informed about his type of apical variant rarely leads to significant arrhythmias, sudden death or outflow tract obstruction.  Continued monitoring was suggested.  He was last seen by Dr. Rennis White 03/15/2018.  During that time he was doing well.  He had lost a significant amount of weight however he had gained it back.  His weight at that time was around 240 pounds.  He denied chest pain and shortness of breath.  He was on low-dose Benzapril and his blood pressure was well controlled.  He denied palpitations.  Cholesterol 4/18 showed an LDL of 100.  09/10/20 had an episode of SVT, EMT called. Resolved  with vagal maneuver and did not have to present to the hospital.   He presents to the clinic today for follow-up evaluation states he and his wife were at an [employer] football game.  He went into SVT.  He indicates that this was the first time this is happened in almost 2 years.  He  attempted his on vagal maneuvers (coughing and bearing down).  These did not convert him back to sinus rhythm so he presented to the medical tent.  He was evaluated by EMTs and they need a chest maneuver was attempted.  This converted him back to normal sinus rhythm and he was instructed to present to cardiology.  He has had no further episodes/events.  He states that he has had some increased work of breathing with physical activity but attributes this to increased weight gain and lack of conditioning.  We discussed vagal maneuvers and adenosine  treatment.  He also discussed the lack of compliance with CPAP.  He states that his mask is not fitting well.  I recommended he present back to sleep medicine for further evaluation and a different mask.  Him and his wife expressed understanding.  I will order a BMP and magnesium.  I will give him the salty 6 diet sheet have him increase his physical activity, focus on weight loss, and stressed compliance with CPAP.  I will touch base with Dr. Rennis White to see whether he recommends repeat echocardiogram at this time.  Today he denies chest pain, shortness of breath, lower extremity edema, fatigue, palpitations, melena, hematuria, hemoptysis, diaphoresis, weakness, presyncope, syncope, orthopnea, and PND.   Home Medications    Prior to Admission medications   Medication Sig Start Date End Date Taking? Authorizing Provider  allopurinol (ZYLOPRIM) 100 MG tablet TAKE 3 TABLETS (300MG  TOTAL) BY MOUTH EVERY DAY 11/27/19   Hilty, 01/25/20, MD  benazepril (LOTENSIN) 10 MG tablet Take 10 mg by mouth daily.    [provider]  Fluticasone-Salmeterol (ADVAIR) 100-50 MCG/DOSE AEPB Inhale 1 puff into the lungs as needed (SOB).     [provider]  montelukast (SINGULAIR) 10 MG tablet Take 10 mg by mouth at bedtime.    [provider]    Family History    Family History  Problem Relation Age of Onset  . Asthma Mother   . Heart disease Father   . Sleep apnea Father   . Prostate cancer Father   . Healthy Brother   . Drug abuse Brother   . Healthy Brother    He indicated that his mother is alive. He indicated that his father is alive. He indicated that both of his brothers are alive.  Social History    Social History   Socioeconomic History  . Marital status: Married    Spouse name: Not on file  . Number of children: Not on file  . Years of education: Not on file  . Highest education level: Not on file  Occupational History  . Not on file  Tobacco Use  . Smoking status:  Never Smoker  . Smokeless tobacco: Never Used  Substance and Sexual Activity  . Alcohol use: No  . Drug use: No  . Sexual activity: Yes  Other Topics Concern  . Not on file  Social History Narrative  . Not on file   Social Determinants of Health   Financial Resource Strain:   . Difficulty of Paying Living Expenses: Not on file  Food Insecurity:   . Worried About Lisette Abu in the Last Year: Not on file  . Ran Out of Food in the Last Year: Not on file  Transportation Needs:   . Lack of Transportation (Medical): Not on file  . Lack of Transportation (Non-Medical): Not on file  Physical Activity:   . Days of Exercise per Week: Not on  file  . Minutes of Exercise per Session: Not on file  Stress:   . Feeling of Stress : Not on file  Social Connections:   . Frequency of Communication with Friends and Family: Not on file  . Frequency of Social Gatherings with Friends and Family: Not on file  . Attends Religious Services: Not on file  . Active Member of Clubs or Organizations: Not on file  . Attends BankerClub or Organization Meetings: Not on file  . Marital Status: Not on file  Intimate Partner Violence:   . Fear of Current or Ex-Partner: Not on file  . Emotionally Abused: Not on file  . Physically Abused: Not on file  . Sexually Abused: Not on file     Review of Systems    General:  No chills, fever, night sweats or weight changes.  Cardiovascular:  No chest pain, dyspnea on exertion, edema, orthopnea, palpitations, paroxysmal nocturnal dyspnea. Dermatological: No rash, lesions/masses Respiratory: No cough, dyspnea Urologic: No hematuria, dysuria Abdominal:   No nausea, vomiting, diarrhea, bright red blood per rectum, melena, or hematemesis Neurologic:  No visual changes, wkns, changes in mental status. All other systems reviewed and are otherwise negative except as noted above.  Physical Exam    VS:  BP 132/86 (BP Location: Right Arm, Patient Position: Sitting, Cuff  Size: Large)   Pulse 66   Ht 6\' 1"  (1.854 m)   Wt 277 lb (125.6 kg)   BMI 36.55 kg/m  , BMI Body mass index is 36.55 kg/m. GEN: Well nourished, well developed, in no acute distress. HEENT: normal. Neck: Supple, no JVD, carotid bruits, or masses. Cardiac: RRR, no murmurs, rubs, or gallops. No clubbing, cyanosis, edema.  Radials/DP/PT 2+ and equal bilaterally.  Respiratory:  Respirations regular and unlabored, clear to auscultation bilaterally. GI: Soft, nontender, nondistended, BS + x 4. MS: no deformity or atrophy. Skin: warm and dry, no rash. Neuro:  Strength and sensation are intact. Psych: Normal affect.  Accessory Clinical Findings    Recent Labs: No results found for requested labs within last 8760 hours.   Recent Lipid Panel No results found for: CHOL, TRIG, HDL, CHOLHDL, VLDL, LDLCALC, LDLDIRECT  ECG personally reviewed by me today-normal sinus rhythm left ventricular hypertrophy with repolarization abnormality 66 bpm no acute changes.  Echocardiogram 12/01/2017 Study Conclusions   - Left ventricle: The cavity size was normal. Systolic function was  normal. The estimated ejection fraction was in the range of 60%  to 65%. Worm motion was normal; there were no regional Fonda  motion abnormalities. Left ventricular diastolic function  parameters were normal.   Impressions:   - Apical Snoke thickness appears qualitatively mildly increased, but  there is no evidence of apical gradient by color Doppler or CW  Doppler and there is no evidence of apical entrapment or aneurysm  formation on Definity images. Overall, no convining evidence for  apical variant hypertrophic cardiomyopathy. Cardiac CT or MRI may  be superior for this diagnosis, but use of these techniques may  be limited by renal disease.    Assessment & Plan   1.  SVT-had 1 extended episode of SVT which appears to have lasted for about 30-45 minutes.  He attempted vagal maneuvers and  presented to ENT tent.  He spontaneously converted with needed chest maneuver. Order BMP, magnesium  Chest pain-no further episodes of chest discomfort/pain.  Underwent Myoview 2016 which showed low risk and no ischemia. Continue to monitor  Palpitations-has not had any recent episodes of rapid  heart rate or palpitations. Heart healthy low-sodium diet-salty 6 given Increase physical activity as tolerated  Hypertrophic cardiomyopathy-notes some DOE or activity intolerance but feels it is related to weight gain and lack of conditioning.  Cardiac MRI 2017 showed apical variant which was not believed to be related to prior MI. Continue to monitor   Essential hypertension-BP today 132/86.  Well-controlled at home Continue Benzapril Heart healthy low-sodium diet-salty 6 given Increase physical activity as tolerated  Hypercholesterolemia-LDL 100 on 4/18 Heart healthy low-sodium high-fiber diet Increase physical activity as tolerated Followed by PCP  Disposition: Follow-up with Dr. Rennis White in 3 months   Thomasene Ripple. Madysyn Hanken NP-C    09/15/2020, 9:15 AM [facility] [address] Office [phone] Fax [phone]  Notice: This dictation was prepared with Dragon dictation along with smaller phrase technology. Any transcriptional errors that result from this process are unintentional and may not be corrected upon review.

## 2020-09-15 ENCOUNTER — Ambulatory Visit (INDEPENDENT_AMBULATORY_CARE_PROVIDER_SITE_OTHER): Payer: PRIVATE HEALTH INSURANCE | Admitting: General Practice

## 2020-09-15 ENCOUNTER — Other Ambulatory Visit: Payer: Self-pay

## 2020-09-15 ENCOUNTER — Encounter: Payer: Self-pay | Admitting: General Practice

## 2020-09-15 VITALS — BP 132/86 | HR 66 | Ht 73.0 in | Wt 277.0 lb

## 2020-09-15 DIAGNOSIS — I10 Essential (primary) hypertension: Secondary | ICD-10-CM | POA: Diagnosis not present

## 2020-09-15 DIAGNOSIS — I208 Other forms of angina pectoris: Secondary | ICD-10-CM | POA: Diagnosis not present

## 2020-09-15 DIAGNOSIS — E7800 Pure hypercholesterolemia, unspecified: Secondary | ICD-10-CM

## 2020-09-15 DIAGNOSIS — R002 Palpitations: Secondary | ICD-10-CM

## 2020-09-15 DIAGNOSIS — I422 Other hypertrophic cardiomyopathy: Secondary | ICD-10-CM

## 2020-09-15 DIAGNOSIS — Z79899 Other long term (current) drug therapy: Secondary | ICD-10-CM

## 2020-09-15 LAB — BASIC METABOLIC PANEL
BUN/Creatinine Ratio: 12 (ref 9–20)
BUN: 14 mg/dL (ref 6–24)
CO2: 22 mmol/L (ref 20–29)
Calcium: 8.9 mg/dL (ref 8.7–10.2)
Chloride: 108 mmol/L — ABNORMAL HIGH (ref 96–106)
Creatinine, Ser: 1.19 mg/dL (ref 0.76–1.27)
GFR calc Af Amer: 77 mL/min/{1.73_m2} (ref >59)
GFR calc non Af Amer: 67 mL/min/{1.73_m2} (ref >59)
Glucose: 108 mg/dL — ABNORMAL HIGH (ref 65–99)
Potassium: 4.4 mmol/L (ref 3.5–5.2)
Sodium: 141 mmol/L (ref 134–144)

## 2020-09-15 LAB — MAGNESIUM: Magnesium: 2 mg/dL (ref 1.6–2.3)

## 2020-09-15 NOTE — Patient Instructions (Signed)
Medication Instructions:  The current medical regimen is effective;  continue present plan and medications as directed. Please refer to the Current Medication list given to you today. *If you need a refill on your cardiac medications before your next appointment, please call your pharmacy*  Lab Work: BMET AND MAG TODAY HERE IN OUR OFFICE If you have labs (blood work) drawn today and your tests are completely normal, you will receive your results only by:  MyChart Message (if you have MyChart) OR A paper copy in the mail.  If you have any lab test that is abnormal or we need to change your treatment, we will call you to review the results. You may go to any [facility] that is convenient for you however, we do have a lab in our office that is able to assist you. You DO NOT need an appointment for our lab. The lab is open 8:00am and closes at 4:00pm. Lunch 12:45 - 1:45pm.  Special Instructions CONTINUE WEIGHT LOSS  PLEASE READ AND FOLLOW SALTY 6-ATTACHED  PLEASE INCREASE PHYSICAL ACTIVITY AS TOLERATED  Follow-Up: Your next appointment:  3 month(s) In Person with K. Italy Hilty, MD -OR- JESSE CLEAVER, FNP-C  At [facility], you and your health needs are our priority.  As part of our continuing mission to provide you with exceptional heart care, we have created designated Provider Care Teams.  These Care Teams include your primary Cardiologist (physician) and Advanced Practice Providers (APPs -  Physician Assistants and Nurse Practitioners) who all work together to provide you with the care you need, when you need it.  We recommend signing up for the patient portal called "MyChart".  Sign up information is provided on this After Visit Summary.  MyChart is used to connect with patients for Virtual Visits (Telemedicine).  Patients are able to view lab/test results, encounter notes, upcoming appointments, etc.  Non-urgent messages can be sent to your provider as well.   To learn more about what you can  do with MyChart, go to [email or web address].

## 2020-12-15 ENCOUNTER — Encounter: Payer: Self-pay | Admitting: *Deleted

## 2020-12-15 ENCOUNTER — Ambulatory Visit (INDEPENDENT_AMBULATORY_CARE_PROVIDER_SITE_OTHER): Payer: PRIVATE HEALTH INSURANCE

## 2020-12-15 ENCOUNTER — Other Ambulatory Visit: Payer: Self-pay

## 2020-12-15 ENCOUNTER — Ambulatory Visit
Admission: EM | Admit: 2020-12-15 | Discharge: 2020-12-15 | Disposition: A | Discharge status: Home / Self Care | Payer: PRIVATE HEALTH INSURANCE | Attending: Emergency Medicine | Admitting: Emergency Medicine

## 2020-12-15 DIAGNOSIS — W19XXXA Unspecified fall, initial encounter: Secondary | ICD-10-CM

## 2020-12-15 DIAGNOSIS — M542 Cervicalgia: Secondary | ICD-10-CM

## 2020-12-15 MED ORDER — CYCLOBENZAPRINE HCL 5 MG PO TABS: 5.0000 mg | ORAL_TABLET | Freq: Two times a day (BID) | ORAL | 0 refills | Status: AC | PRN

## 2020-12-15 MED ORDER — NAPROXEN 500 MG PO TABS: 500.0000 mg | ORAL_TABLET | Freq: Two times a day (BID) | ORAL | 0 refills | Status: AC

## 2020-12-15 NOTE — ED Provider Notes (Signed)
[facility]    CSN: [id number] Arrival date & time: 12/15/20  1208      History   Chief Complaint Chief Complaint  Patient presents with  . Neck Injury  . Headache    HPI Theodore White is a 60 y.o. male  With history as below presenting for bilateral trapezius pain.  Provides history: States that he fell back flat against his back on Tuesday.  No LOC, headaches, vomiting, memory issues.  Has had tightness, spasms that are worse at night.  States overall is improved, though wanting x-ray to make sure everything is okay given his history of cervicalgia.  Past Medical History:  Diagnosis Date  . Arthritis    "knees and ankles" (04/18/2014)  . Asthma   . Chronic kidney disease    stage 3 renal failure under control  . Gout   . Hypertension     Patient Active Problem List   Diagnosis Date Noted  . Apical variant hypertrophic cardiomyopathy (HCC) 12/24/2015  . OSA on CPAP 12/24/2015  . Essential hypertension 10/22/2015  . Pure hypercholesterolemia 10/08/2015  . Chronic kidney disease due to hypertension 10/08/2015  . Chronic kidney disease (CKD), stage III (moderate) (HCC) 10/08/2015  . Snores 10/08/2015  . Awareness of heartbeats 10/07/2015  . Chest pressure 10/07/2015  . Acute idiopathic gout of left knee 04/18/2014  . Tear of medial meniscus of left knee 04/18/2014  . Septic arthritis of knee, left (HCC) 04/17/2014    Past Surgical History:  Procedure Laterality Date  . ASPIRATION BIOPSY Right 04/17/2014   ankle; "to assess swelling; they weren't able to aspirate anything though"  . COLONOSCOPY    . KNEE ARTHROSCOPY Left 04/17/2014   Procedure: ARTHROSCOPY KNEE with medial menisectomy, chondralplasty of patella-femoral joint, extensive synovectomy, aspiration of right ankle;  Surgeon: Harvie Junior, MD;  Location: [facility] OR;  Service: Orthopedics;  Laterality: Left;  . KNEE ARTHROSCOPY WITH MEDIAL MENISECTOMY Left 04/17/2014   chondralplasty of  patella-femoral joint, extensive synovectomy  . TOOTH EXTRACTION     "just 2 teeth"       Home Medications    Prior to Admission medications   Medication Sig Start Date End Date Taking? Authorizing Provider  cyclobenzaprine (FLEXERIL) 5 MG tablet Take 1 tablet (5 mg total) by mouth 2 (two) times daily as needed for up to 7 days for muscle spasms. 12/15/20 12/22/20 Yes Hall-Potvin, [place], PA-C  naproxen (NAPROSYN) 500 MG tablet Take 1 tablet (500 mg total) by mouth 2 (two) times daily. 12/15/20  Yes Hall-Potvin, [place], PA-C  allopurinol (ZYLOPRIM) 100 MG tablet TAKE 3 TABLETS (300MG  TOTAL) BY MOUTH EVERY DAY 11/27/19   Hilty, 01/25/20, MD  benazepril (LOTENSIN) 10 MG tablet Take 10 mg by mouth daily.    [provider]  Fluticasone-Salmeterol (ADVAIR) 100-50 MCG/DOSE AEPB Inhale 1 puff into the lungs as needed (SOB).     [provider]  montelukast (SINGULAIR) 10 MG tablet Take 10 mg by mouth at bedtime.    [provider]    Family History Family History  Problem Relation Age of Onset  . Asthma Mother   . Heart disease Father   . Sleep apnea Father   . Prostate cancer Father   . Healthy Brother   . Drug abuse Brother   . Healthy Brother     Social History Social History   Tobacco Use  . Smoking status: Never Smoker  . Smokeless tobacco: Never Used  Substance Use Topics  .  Alcohol use: No  . Drug use: No     Allergies   Penicillins   Review of Systems Review of Systems  Constitutional: Negative for fatigue and fever.  Respiratory: Negative for cough and shortness of breath.   Cardiovascular: Negative for chest pain and palpitations.  Gastrointestinal: Negative for abdominal pain, diarrhea and vomiting.  Musculoskeletal: Positive for back pain, neck pain and neck stiffness. Negative for arthralgias and myalgias.  Skin: Negative for rash and wound.  Neurological: Negative for speech difficulty and headaches.  All other systems  reviewed and are negative.    Physical Exam Triage Vital Signs ED Triage Vitals [12/15/20 1450]  Enc Vitals Group     BP      Pulse      Resp      Temp      Temp src      SpO2      Weight      Height      Head Circumference      Peak Flow      Pain Score 4     Pain Loc      Pain Edu?      Excl. in GC?    No data found.  Updated Vital Signs BP (!) 167/97 (BP Location: Left Arm)   Pulse 90   Temp 98.8 F (37.1 C) (Oral)   Resp 20   SpO2 94%   Visual Acuity Right Eye Distance:   Left Eye Distance:   Bilateral Distance:    Right Eye Near:   Left Eye Near:    Bilateral Near:     Physical Exam Constitutional:      General: He is not in acute distress. HENT:     Head: Normocephalic and atraumatic.  Eyes:     General: No scleral icterus.    Pupils: Pupils are equal, round, and reactive to light.  Cardiovascular:     Rate and Rhythm: Normal rate.  Pulmonary:     Effort: Pulmonary effort is normal. No respiratory distress.     Breath sounds: No wheezing.  Musculoskeletal:       Arms:     Comments: Lateral trapezius tenderness, stiffness.  No spinous process tenderness, deformity, crepitus of C-spine.  Scapula, anterior shoulder unremarkable.  No clavicular tenderness, swelling.  Skin:    Coloration: Skin is not jaundiced or pale.  Neurological:     Mental Status: He is alert and oriented to person, place, and time.      UC Treatments / Results  Labs (all labs ordered are listed, but only abnormal results are displayed) Labs Reviewed - No data to display  EKG   Radiology DG Cervical Spine Complete  Result Date: 12/15/2020 CLINICAL DATA:  Larey Seat and hit back of head and neck last week, neck pain EXAM: CERVICAL SPINE - COMPLETE 4+ VIEW COMPARISON:  None. FINDINGS: Frontal, bilateral oblique, and lateral views of the cervical spine are obtained. Alignment is anatomic to the cervicothoracic junction. There are chronic appearing fractures through the C6 and  C7 spinous processes, with well corticated margins. No other acute bony abnormalities. There is mild disc space narrowing at C5-6 and C6-7. Uncovertebral and facet hypertrophy at C3-4 results in mild symmetrical neural foraminal encroachment. Prevertebral soft tissues are unremarkable. Lung apices are clear. IMPRESSION: 1. No acute cervical spine fracture. 2. Chronic appearing fractures through the spinous processes at C6 and C7. 3. Mild cervical spondylosis, most pronounced at C3-4 with minimal symmetrical neural foraminal encroachment. Electronically  Signed   By: Sharlet Salina M.D.   On: 12/15/2020 15:43    Procedures Procedures (including critical care time)  Medications Ordered in UC Medications - No data to display  Initial Impression / Assessment and Plan / UC Course  I have reviewed the triage vital signs and the nursing notes.  Pertinent labs & imaging results that were available during my care of the patient were reviewed by me and considered in my medical decision making (see chart for details).     X-ray without acute findings.  Will treat supportively as below.  Return precautions discussed, pt verbalized understanding and is agreeable to plan. Final Clinical Impressions(s) / UC Diagnoses   Final diagnoses:  Fall, initial encounter  Muscle pain, cervical     Discharge Instructions       Heat therapy (hot compress, warm wash rag, hot showers, etc.) can help relax muscles and soothe muscle aches. Cold therapy (ice packs) can be used to help swelling both after injury and after prolonged use of areas of chronic pain/aches.  Pain medication:  500 mg Naprosyn/Aleve (naproxen) every 12 hours with food:  AVOID other NSAIDs while taking this (may have Tylenol).  May take muscle relaxer as needed for severe pain / spasm.  (This medication may cause you to become tired so it is important you do not drink alcohol or operate heavy machinery while on this medication.  Recommend  your first dose to be taken before bedtime to monitor for side effects safely)  Important to follow up with specialist(s) below for further evaluation/management if your symptoms persist or worsen.    ED Prescriptions    Medication Sig Dispense Auth. Provider   naproxen (NAPROSYN) 500 MG tablet Take 1 tablet (500 mg total) by mouth 2 (two) times daily. 30 tablet Hall-Potvin, [place], PA-C   cyclobenzaprine (FLEXERIL) 5 MG tablet Take 1 tablet (5 mg total) by mouth 2 (two) times daily as needed for up to 7 days for muscle spasms. 14 tablet Hall-Potvin, [place], PA-C     I have reviewed the PDMP during this encounter.   Hall-Potvin, [place], [place] 12/15/20 1620

## 2020-12-15 NOTE — Discharge Instructions (Signed)

## 2020-12-15 NOTE — ED Triage Notes (Signed)
Pt reports last Tuesday he fell flat on his back hit his head and neck. Starting last night pt has been unable to sleep due to neck pain. Pt also reports his head still hurts.

## 2020-12-16 ENCOUNTER — Encounter: Payer: Self-pay | Admitting: Internal Medicine

## 2020-12-16 ENCOUNTER — Ambulatory Visit (INDEPENDENT_AMBULATORY_CARE_PROVIDER_SITE_OTHER): Payer: PRIVATE HEALTH INSURANCE | Admitting: Internal Medicine

## 2020-12-16 VITALS — BP 126/92 | HR 74 | Ht 72.5 in | Wt 274.0 lb

## 2020-12-16 DIAGNOSIS — I422 Other hypertrophic cardiomyopathy: Secondary | ICD-10-CM | POA: Diagnosis not present

## 2020-12-16 DIAGNOSIS — G4733 Obstructive sleep apnea (adult) (pediatric): Secondary | ICD-10-CM

## 2020-12-16 DIAGNOSIS — I471 Supraventricular tachycardia: Secondary | ICD-10-CM | POA: Diagnosis not present

## 2020-12-16 DIAGNOSIS — I10 Essential (primary) hypertension: Secondary | ICD-10-CM

## 2020-12-16 DIAGNOSIS — E7800 Pure hypercholesterolemia, unspecified: Secondary | ICD-10-CM

## 2020-12-16 MED ORDER — METOPROLOL TARTRATE 25 MG PO TABS: 25.0000 mg | ORAL_TABLET | ORAL | 0 refills | Status: DC | PRN

## 2020-12-16 NOTE — Patient Instructions (Signed)
Medication Instructions:  START- Metoprolol Tartrate 25 mg by mouth as needed for palpitations  *If you need a refill on your cardiac medications before your next appointment, please call your pharmacy*   Lab Work: None ordered   Testing/Procedures: None Ordered   Follow-Up: At [facility], you and your health needs are our priority.  As part of our continuing mission to provide you with exceptional heart care, we have created designated Provider Care Teams.  These Care Teams include your primary Cardiologist (physician) and Advanced Practice Providers (APPs -  Physician Assistants and Nurse Practitioners) who all work together to provide you with the care you need, when you need it.  We recommend signing up for the patient portal called "MyChart".  Sign up information is provided on this After Visit Summary.  MyChart is used to connect with patients for Virtual Visits (Telemedicine).  Patients are able to view lab/test results, encounter notes, upcoming appointments, etc.  Non-urgent messages can be sent to your provider as well.   To learn more about what you can do with MyChart, go to [email or web address].    Your next appointment:   1 year(s)  The format for your next appointment:   In Person  Provider:   You may see Chrystie Nose, MD or one of the following Advanced Practice Providers on your designated Care Team:    Azalee Course, PA-C  Micah Flesher, PA-C or   Judy Pimple, [place]

## 2020-12-16 NOTE — Progress Notes (Signed)
OFFICE NOTE  Chief Complaint:  No complaints  Primary Care Physician: Maurice SmallGriffin, Elaine, MD  HPI:  Theodore White is a 60 year old male patient who is referred to me for evaluation of palpitations and chest pain. He reports about 3 weeks ago while eating a meal he was noted to have a feeling of a wave of lightheadedness go across his head. This was followed by racing of his heart, diaphoresis and some right sided chest pressure. Apparently both of his daughters are nurses and they were able to put her hand on his chest and guesstimated his heart rate was possibly close to 200. This lasted apparently for about 10-15 minutes and then resolve spontaneously. He had a similar episode perhaps about 6 months ago but that did not last but a few minutes. He has not reported any worsening shortness of breath or chest pain with exertion, but is been fairly sedentary since his knee surgery about a year ago. He does have cardiac risk factors including hypertension and reportedly has stage III chronic kidney disease secondary to hypertension, but he is only on a low-dose of benazepril and blood pressure is fairly well-controlled. His creatinine most recently was 1.32 which is not significantly reduced given his age. Nevertheless, he does have an abnormal EKG which shows significant LVH by voltage and T-wave inversions which are deep in leads V3 through V6 as well as 1 and aVL and lead 2. He did fill out a sleep questionnaire which indicate significant chance of dosing with a sleepiness score greater than 10. His wife reports that he is a snorer and has been noted to stop breathing at times. He does feel daytime fatigue and somnolence and has a thick neck.  Theodore White returns today for follow-up. He reports feeling fairly well. He denies any chest pain or shortness of breath. He did undergo a sleep study last evening and felt somewhat tired today. He says that they stop the study part way and fit him with a mask, but he  seemed to be intolerant of it. We do not have a final report but it suggests that he does have some sleep apnea. I encouraged him to be open minded about wearing a CPAP as it may be beneficial for him. His wife reiterated that he is been snoring and having apneic episodes for 20-30 years she's known him. With regards to his testing, he underwent nuclear stress testing as well as an echocardiogram. The results are below:   The left ventricular ejection fraction is normal (55-65%).  Nuclear stress EF: 56%.  There was no ST segment deviation noted during stress.  This is a low risk study.  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  Study Conclusions  - Procedure narrative: Transthoracic echocardiography. Image quality was adequate. The study was technically difficult, as a result of poor acoustic windows. Intravenous contrast (Definity) was administered. - Left ventricle: The cavity size was normal. There was mild concentric hypertrophy. Systolic function was normal. The estimated ejection fraction was in the range of 60% to 65%. Huizenga motion was normal; there were no regional Bugarin motion abnormalities. Doppler parameters are consistent with abnormal left ventricular relaxation (grade 1 diastolic dysfunction). - Aortic valve: There was no regurgitation. - Aortic root: The aortic root was normal in size. - Ascending aorta: The ascending aorta was normal  in size. - Mitral valve: There was no regurgitation. - Left atrium: The atrium was moderately dilated. - Right ventricle: Systolic function was normal. - Right atrium: The atrium was normal in size. - Tricuspid valve: There was no regurgitation. - Pulmonic valve: There was no regurgitation. - Pulmonary arteries: Systolic  pressure was within the normal range. - Inferior vena cava: The vessel was normal in size. - Pericardium, extracardiac: There was no pericardial effusion.  I discussed the results with him as well as the fact that there are discrepancies. The nuclear stress test suggests there is a fixed anterior Loge defect concerning for prior infarct as well as significant apical uptake which may be related to apical hypertrophic cardiomyopathy. Interestingly, the LVEF is normal and the anterior Vidrine motion is normal. The echocardiogram also showed normal anterior Ellerson motion without any evidence of scar however the study was limited and required contrast to get decent images. There is certainly discrepancy between both studies with equivocal findings. His EKG showing deep anterior and lateral T-wave inversions could suggest both ischemia, scar or apical hypertrophic cardiomyopathy. Is not certain whether the tests have answer the question of what the cause of his abnormal EKG changes are.  Theodore White returns today for follow-up of his cardiac MRI, the results are below:  EXAM: CARDIAC MRI  TECHNIQUE: The patient was scanned on a 1.5 Tesla GE magnet. A dedicated cardiac coil was used. Functional imaging was done using Fiesta sequences. 2,3, and 4 chamber views were done to assess for RWMA's. Modified Simpson's rule using a short axis stack was used to calculate an ejection fraction on a dedicated work [employer]. The patient received 35 cc of Multihance. After 10 minutes inversion recovery sequences were used to assess for infiltration and scar tissue.  CONTRAST: 35 cc Multihance  FINDINGS: Limited images of the lung fields showed no significant abnormalities.  Normal left ventricular size and systolic function, EF 61%. There was asymmetric hypertrophy of the apical LV segments and the true apex. Not marked, but there was a clear difference compared to the mid and basal LV  segments. Normal Zuckerman motion. Normal right ventricular size and systolic function. Mild left atrial enlargement. Normal right atrium. Trileaflet aortic valve with no stenosis or regurgitation. Trivial mitral regurgitation.  On delayed enhancement imaging, there was a small area of late gadolinium enhancement (LGE) at the true apex. Difficult to tell if subendocardial versus mid-Troost.  MEASUREMENTS: MEASUREMENTS LVEDV 169 mL  LV SV 103  LV EF 61%  IMPRESSION: 1. Normal LV size and systolic function, EF 61%.  2. Pattern of asymmetric hypertrophy involving the LV apex. Not marked, but clear difference from mid and basal Bhardwaj segments.  3. Small area of LGE noted at the apex, hard to tell if subendocardial or mid-Weideman.  4. Overall, suggestive of apical variant hypertrophic cardiomyopathy. Given totality of findings, think less likely prior apical MI.  Dalton Mclean   Electronically Signed  By: Marca Ancona M.D.  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 There is a small area gadolinium enhancement which is late at the apex which could be either sub-endocardial or myocardial, but this is not thought to be an ischemic scar. It could be due to some basal cardial necrosis if the muscle has become very thick. We talked about the fact that apical variant hypertrophic cardiomyopathy rarely leads to significant arrhythmias, sudden death or outflow tract obstruction.  In the be reasonable for him to have ongoing monitoring. He should continue his current blood pressure medications which are also helpful as they may provide some benefit in decreasing the risk of worsening cardiomyopathy.  12/27/2016  Theodore White was seen today in follow-up. He has done exceedingly well. I commended him over significant weight loss. He's had 55 pound weight loss over the past year. This is been  significantly impact fullness health. His blood pressure had become fairly low with low pulse rate and his beta blocker was discontinued. He remains on low-dose benazepril. He denies any tachycardia palpitations, chest pain or worsening shortness of breath with exertion. His last echo was in 2016 and cardiac MRI in 2017.  03/15/2018  Theodore White was seen today in follow-up.  He continues to do well.  He had significant weight loss last year unfortunately he is gained the weight back.  He is now 240 pounds.  He denies any chest pain or worsening shortness of breath.  He is on low-dose benazepril and blood pressure is well controlled.  He has no palpitations.  Cardiac MRI was in 2017.  This confirmed the diagnosis of apical variant hypertrophic cardiomyopathy.  Labs from April 2018 showed total cholesterol 169, HDL 46, LDL 100 and triglycerides [id number].  12/16/2020  Theodore White returns today for follow-up. He seems to be doing well. Unfortunately weight is gone back up now to 274 pounds. At one point he was down to 240 in 2019. He does have sleep apnea is but has been noncompliant recently with his equipment. His sleep study was over 5 years ago he may need a repeat if he is interested. He does have a history of apical variant hypertrophic cardiomyopathy based on MRI in 2017. 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 This was in October 2021 and he had another episode last November. The second episode lasted for about 10 minutes and improved with vagal maneuvers the initial episode was about 2 hours.   PMHx:  Past Medical History:  Diagnosis Date   Arthritis    "knees and ankles" (04/18/2014)   Asthma    Chronic kidney disease    stage 3 renal failure under control   Gout    Hypertension     Past Surgical History:  Procedure Laterality Date   ASPIRATION BIOPSY Right 04/17/2014   ankle; "to assess swelling; they weren't able to aspirate anything though"    COLONOSCOPY     KNEE ARTHROSCOPY Left 04/17/2014   Procedure: ARTHROSCOPY KNEE with medial menisectomy, chondralplasty of patella-femoral joint, extensive synovectomy, aspiration of right ankle;  Surgeon: Harvie JuniorJohn L Graves, MD;  Location: [facility] OR;  Service: Orthopedics;  Laterality: Left;   KNEE ARTHROSCOPY WITH MEDIAL MENISECTOMY Left 04/17/2014   chondralplasty of patella-femoral joint, extensive synovectomy   TOOTH EXTRACTION     "just 2 teeth"    FAMHx:  Family History  Problem Relation Age of Onset   Asthma Mother    Heart disease Father    Sleep apnea Father    Prostate cancer Father    Healthy Brother    Drug abuse Brother    Healthy Brother     SOCHx:   reports that he has never smoked. He has never used smokeless tobacco. He reports that he does not drink alcohol and does not use drugs.  ALLERGIES:  Allergies  Allergen Reactions   Penicillins Other (See Comments)    Had med as a child  doesn't know the reaction    ROS: Pertinent items noted in HPI and remainder of comprehensive ROS otherwise negative.  HOME MEDS: Current Outpatient Medications  Medication Sig Dispense Refill   allopurinol (ZYLOPRIM) 100 MG tablet TAKE 3 TABLETS (300MG  TOTAL) BY MOUTH EVERY DAY 90 tablet 0   benazepril (LOTENSIN) 10 MG tablet Take 10 mg by mouth daily.     cyclobenzaprine (FLEXERIL) 5 MG tablet Take 1 tablet (5 mg total) by mouth 2 (two) times daily as needed for up to 7 days for muscle spasms. 14 tablet 0   Fluticasone-Salmeterol (ADVAIR) 100-50 MCG/DOSE AEPB Inhale 1 puff into the lungs as needed (SOB).      montelukast (SINGULAIR) 10 MG tablet Take 10 mg by mouth at bedtime.     naproxen (NAPROSYN) 500 MG tablet Take 1 tablet (500 mg total) by mouth 2 (two) times daily. 30 tablet 0   No current facility-administered medications for this visit.    LABS/IMAGING: No results found for this or any previous visit (from the past 48 hour(s)). DG Cervical Spine  Complete  Result Date: 12/15/2020 CLINICAL DATA:  12/17/2020 and hit back of head and neck last week, neck pain EXAM: CERVICAL SPINE - COMPLETE 4+ VIEW COMPARISON:  None. FINDINGS: Frontal, bilateral oblique, and lateral views of the cervical spine are obtained. Alignment is anatomic to the cervicothoracic junction. There are chronic appearing fractures through the C6 and C7 spinous processes, with well corticated margins. No other acute bony abnormalities. There is mild disc space narrowing at C5-6 and C6-7. Uncovertebral and facet hypertrophy at C3-4 results in mild symmetrical neural foraminal encroachment. Prevertebral soft tissues are unremarkable. Lung apices are clear. IMPRESSION: 1. No acute cervical spine fracture. 2. Chronic appearing fractures through the spinous processes at C6 and C7. 3. Mild cervical spondylosis, most pronounced at C3-4 with minimal symmetrical neural foraminal encroachment. Electronically Signed   By: Larey Seat M.D.   On: 12/15/2020 15:43    WEIGHTS: Wt Readings from Last 3 Encounters:  12/16/20 274 lb (124.3 kg)  09/15/20 277 lb (125.6 kg)  03/15/18 240 lb (108.9 kg)    VITALS: BP (!) 126/92    Pulse 74    Ht 6' 0.5" (1.842 m)    Wt 274 lb (124.3 kg)    SpO2 98%    BMI 36.65 kg/m   EXAM: General appearance: alert and no distress Neck: no carotid bruit and no JVD Lungs: clear to auscultation bilaterally Heart: regular rate and rhythm, S1, S2 normal, no murmur, click, rub or gallop Abdomen: soft, non-tender; bowel sounds normal; no masses,  no organomegaly Extremities: extremities normal, atraumatic, no cyanosis or edema Pulses: 2+ and symmetric Skin: Skin color, texture, turgor normal. No rashes or lesions Neurologic: Grossly normal Psych: Pleasant  EKG: Normal sinus rhythm at 74, lateral T wave inversions-stable, personally reviewed  ASSESSMENT: 1. Chest pain and tachy-palpitations - resolved (low risk myoview 2016) 2. Abnormal EKG - findings on cMRI  indicated apical variant HCM - late gadolinium enhancement at the apex (2017) 3. Moderate obesity 4. OSA - non-compliant with CPAP 5. 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 He had a shorter duration episode after that. These episodes are likely to recur and if it is not improved with vagal maneuvers, I will provide him with a low-dose of as needed metoprolol to use for that. He has  been noncompliant with CPAP and I think that could increase his risk of having these episodes. I encouraged him to use his equipment. Blood pressure is reasonable today. He denies any chest pain. EKG shows stable lateral T wave inversions consistent with known apical variant hypertrophic cardiomyopathy. Plan follow-up with me annually or sooner as necessary.  Chrystie Nose, MD, [facility], FACP  [place]   [facility]  Medical Director of the [facility]  [facility] Diplomate of the American Board of Clinical Lipidology Attending Cardiologist  Direct Dial: [phone]   Fax: [phone]  Website:  [email or web address] 12/16/2020, 8:48 AM

## 2021-04-02 ENCOUNTER — Other Ambulatory Visit: Payer: Self-pay | Admitting: Family Medicine

## 2021-04-02 DIAGNOSIS — R7989 Other specified abnormal findings of blood chemistry: Secondary | ICD-10-CM

## 2021-04-28 ENCOUNTER — Ambulatory Visit
Admission: RE | Admit: 2021-04-28 | Discharge: 2021-04-28 | Disposition: A | Discharge status: Home / Self Care | Payer: PRIVATE HEALTH INSURANCE | Source: Ambulatory Visit | Attending: Family Medicine | Admitting: Family Medicine

## 2021-04-28 DIAGNOSIS — R7989 Other specified abnormal findings of blood chemistry: Secondary | ICD-10-CM

## 2021-05-01 ENCOUNTER — Other Ambulatory Visit: Payer: Self-pay | Admitting: Family Medicine

## 2021-05-01 DIAGNOSIS — R935 Abnormal findings on diagnostic imaging of other abdominal regions, including retroperitoneum: Secondary | ICD-10-CM

## 2021-05-20 ENCOUNTER — Other Ambulatory Visit: Payer: Self-pay

## 2021-05-20 ENCOUNTER — Ambulatory Visit
Admission: RE | Admit: 2021-05-20 | Discharge: 2021-05-20 | Disposition: A | Discharge status: Home / Self Care | Payer: No Typology Code available for payment source | Source: Ambulatory Visit | Attending: Family Medicine | Admitting: Family Medicine

## 2021-05-20 DIAGNOSIS — R935 Abnormal findings on diagnostic imaging of other abdominal regions, including retroperitoneum: Secondary | ICD-10-CM

## 2021-05-20 MED ORDER — GADOBENATE DIMEGLUMINE 529 MG/ML IV SOLN
20.0000 mL | Freq: Once | INTRAVENOUS | Status: AC | PRN
  Administered 2021-05-20: 17:00:00 20 mL via INTRAVENOUS

## 2021-08-11 ENCOUNTER — Ambulatory Visit: Payer: No Typology Code available for payment source

## 2022-02-25 IMAGING — MR MR ABDOMEN WO/W CM
18 of 19 series · 43 of 48 positions shown · IV contrast (multihance)
Comparison: Ultrasound on 04/28/2021

CLINICAL DATA: Elevated liver function tests. Mild biliary ductal
dilatation and possible hepatic mass on recent ultrasound.

EXAM:
MRI ABDOMEN WITHOUT AND WITH CONTRAST
TECHNIQUE: Multiplanar multisequence MR imaging of the abdomen was performed
both before and after the administration of intravenous contrast.
CONTRAST:  20mL MULTIHANCE GADOBENATE DIMEGLUMINE 529 MG/ML IV SOLN

[Series 3: T2 · coronal · 5.0mm · 1.76mm/px · 2 of 42 slices shown (1 of 4)]
[im 1/42]
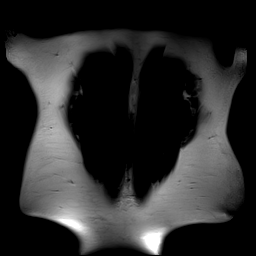
[im 42/42]
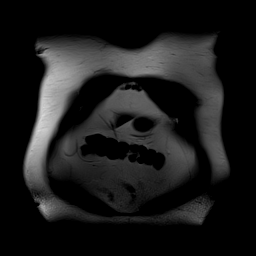

[Series 4: T1 · axial · 3.0mm · 1.41mm/px · z∈[-154,+106]mm · 6 of 176 slices shown]
[im 1/176]
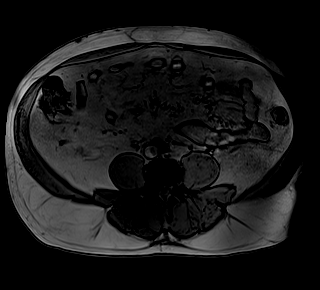
[im 36/176]
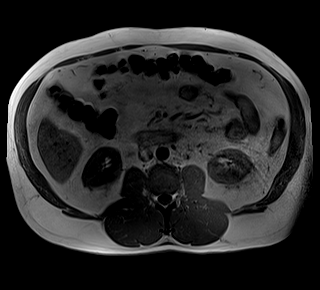
[im 71/176]
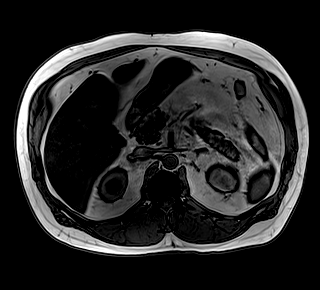
[im 106/176]
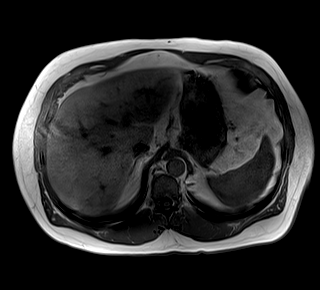
[im 141/176]
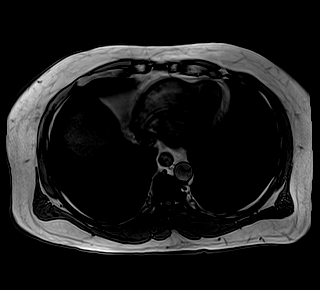
[im 176/176]
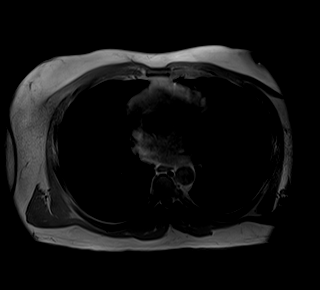

[Series 5: bSSFP · axial · 5.0mm · 1.41mm/px · 1 of 42 slices shown]
[im 1/42]
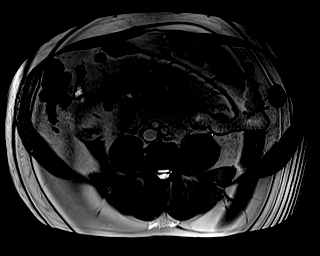

[Series 6: T2 · axial · 5.0mm · 1.76mm/px · 1 of 45 slices shown (2 of 4)]
[im 1/45]
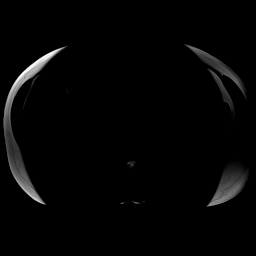

[Series 7: DWI · axial · 5.0mm · 1.68mm/px · z∈[-122,+123]mm · 4 of 126 slices shown (1 of 2)]
[im 1/126]
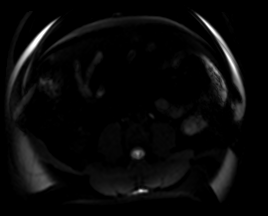
[im 42/126]
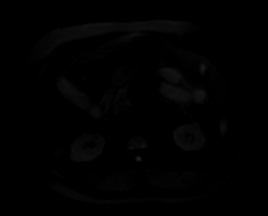
[im 84/126]
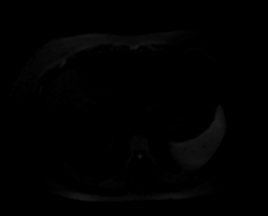
[im 126/126]
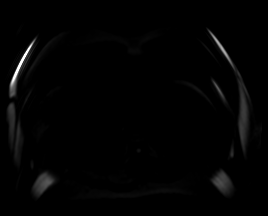

[Series 8: DWI · axial · 5.0mm · 1.68mm/px · 1 of 42 slices shown (2 of 2)]
[im 1/42]
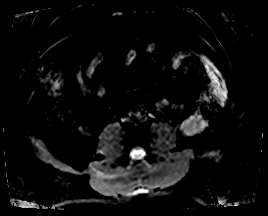

[Series 9: T2 · axial · 6.0mm · 1.41mm/px · 1 of 32 slices shown (3 of 4)]
[im 1/32]
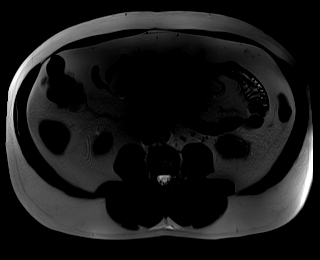

[Series 10: MRCP · coronal · 1.0mm · 0.49mm/px · 2 of 64 slices shown]
[im 1/64]
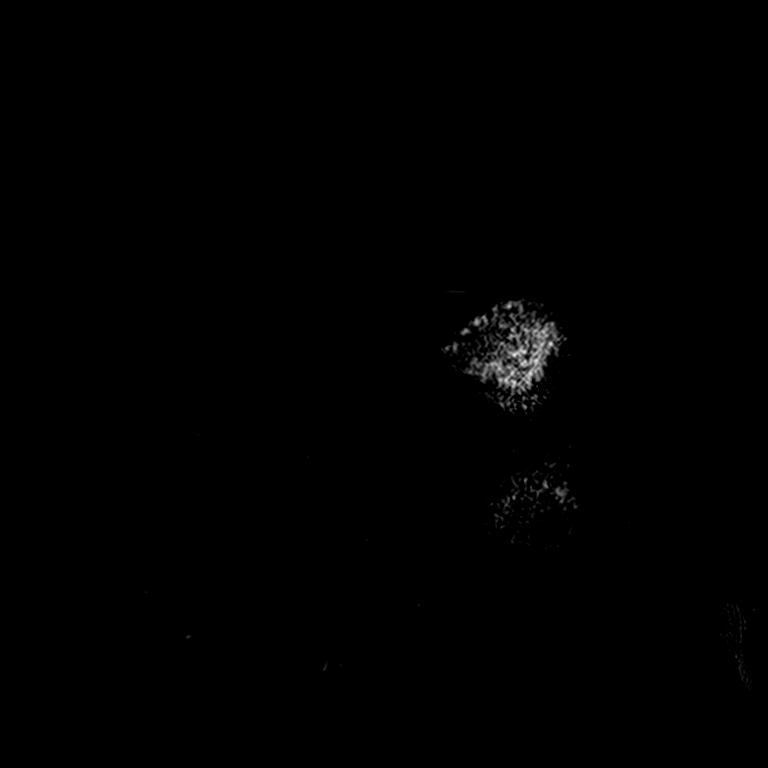
[im 64/64]
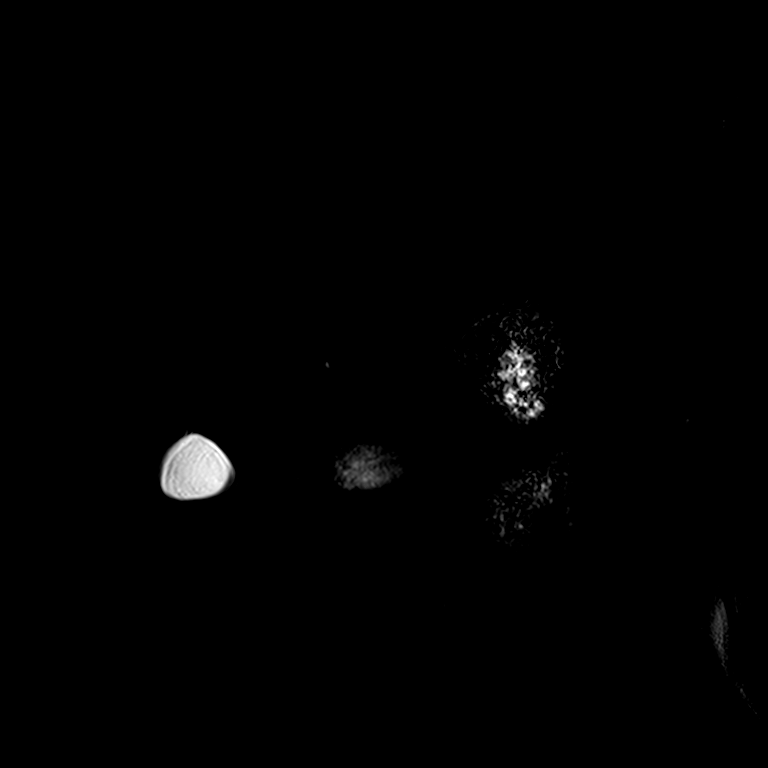

[Series 12: T2 · coronal · 3.0mm · 1.19mm/px · 1 of 19 slices shown (4 of 4)]
[im 1/19]
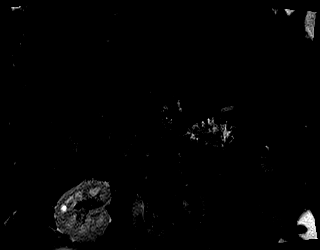

[Series 13: T1 dynamic · axial · non-contrast · 3.0mm · 1.38mm/px · z∈[-167,+94]mm · 3 of 88 slices shown]
[im 1/88]
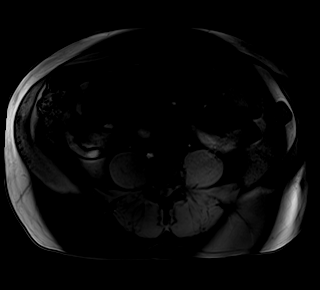
[im 44/88]
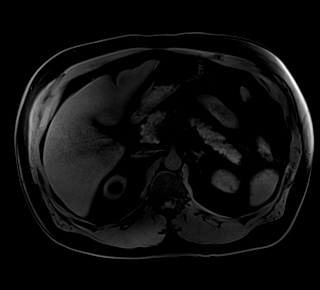
[im 88/88]
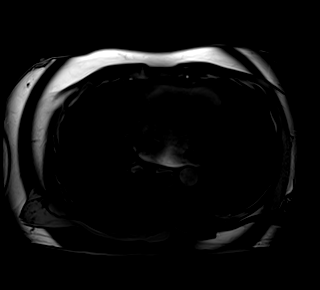

[Series 14: T1 dynamic post-contrast · axial · 3.0mm · 1.38mm/px · z∈[-167,+94]mm · 3 of 88 slices shown (1 of 8)]
[im 1/88]
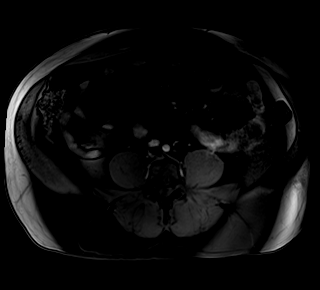
[im 44/88]
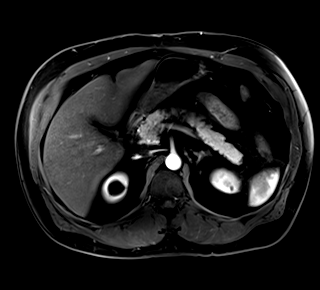
[im 88/88]
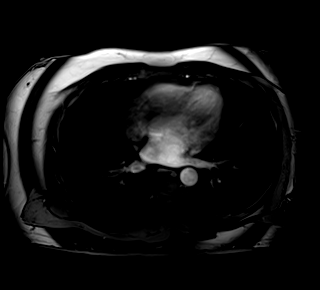

[Series 15: T1 dynamic post-contrast · axial · 3.0mm · 1.38mm/px · z∈[-167,+94]mm · 3 of 88 slices shown (2 of 8)]
[im 1/88]
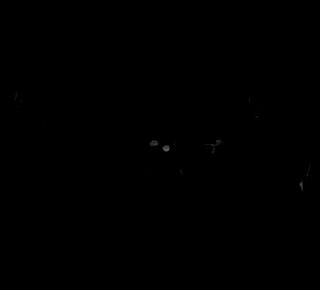
[im 44/88]
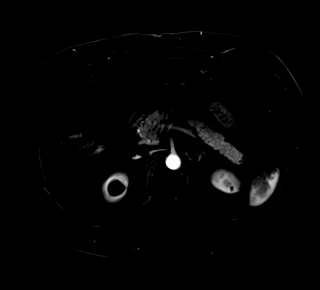
[im 88/88]
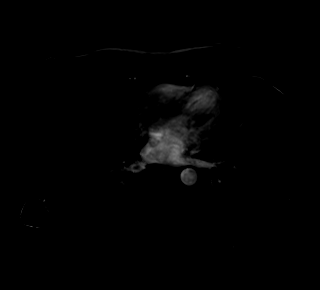

[Series 16: T1 dynamic post-contrast · axial · 3.0mm · 1.38mm/px · z∈[-167,+94]mm · 3 of 88 slices shown (3 of 8)]
[im 1/88]
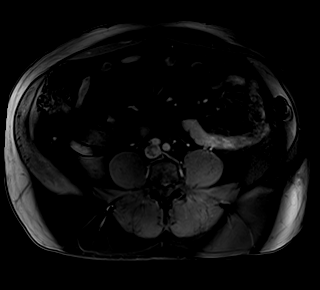
[im 44/88]
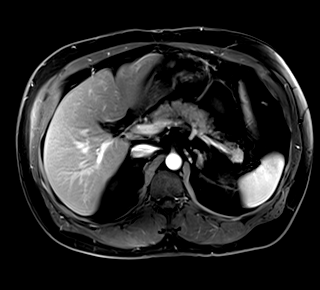
[im 88/88]
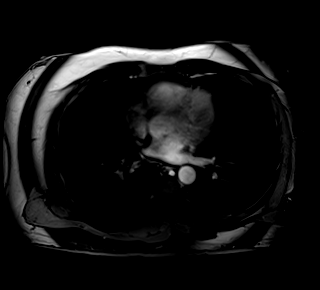

[Series 17: T1 dynamic post-contrast · axial · 3.0mm · 1.38mm/px · z∈[-167,+94]mm · 3 of 88 slices shown (4 of 8)]
[im 1/88]
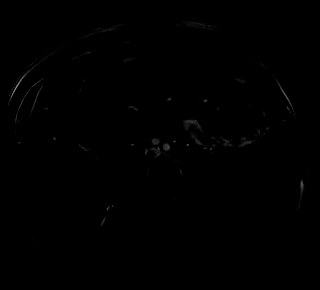
[im 44/88]
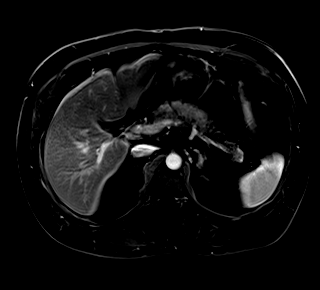
[im 88/88]
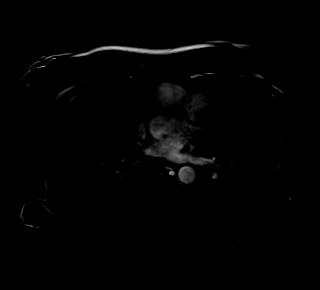

[Series 18: T1 dynamic post-contrast · axial · 3.0mm · 1.38mm/px · z∈[-167,+94]mm · 3 of 88 slices shown (5 of 8)]
[im 1/88]
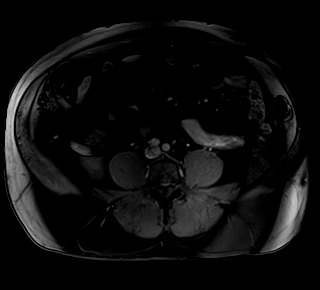
[im 44/88]
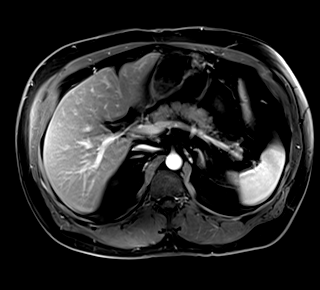
[im 88/88]
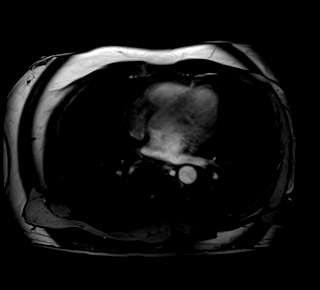

[Series 19: T1 dynamic post-contrast · axial · 3.0mm · 1.38mm/px · z∈[-167,+94]mm · 3 of 88 slices shown (6 of 8)]
[im 1/88]
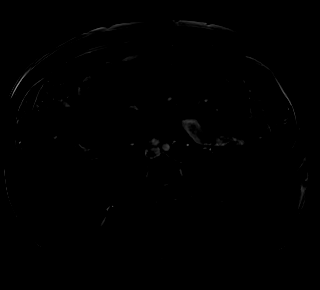
[im 44/88]
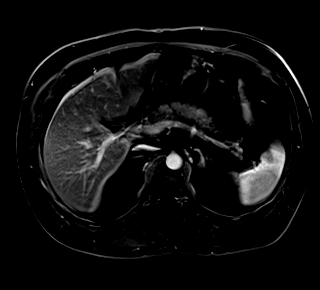
[im 88/88]
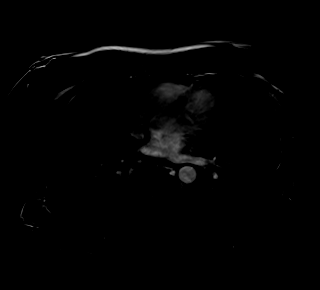

[Series 20: T1 dynamic post-contrast · coronal · 3.0mm · 1.56mm/px · 2 of 72 slices shown (7 of 8)]
[im 1/72]
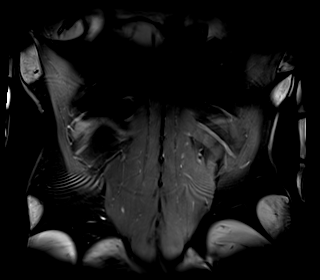
[im 72/72]
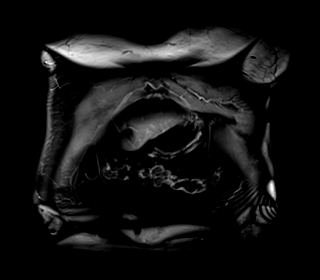

[Series 21: T1 dynamic post-contrast · axial · 3.0mm · 1.38mm/px · 1 of 88 slices shown (8 of 8)]
[im 1/88]
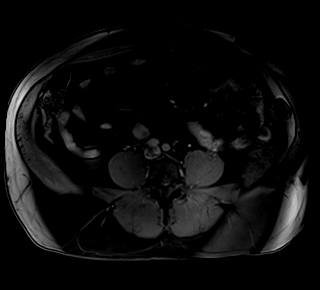

[43 of 48 positions shown; findings below may reference images not displayed]

FINDINGS: Lower chest: No acute findings.

Hepatobiliary: Moderate diffuse hepatic steatosis is demonstrated on
chemical shift imaging. Focal fatty sparing is seen in the central
left lobe adjacent to the gallbladder fossa, which corresponds to
the liver lesion seen on recent ultrasound. No hepatic masses are
identified. Gallbladder is unremarkable. No evidence of biliary
ductal dilatation or choledocholithiasis.

Pancreas: No mass or inflammatory changes. No evidence of pancreatic
ductal dilatation or pancreas divisum.

Spleen:  Within normal limits in size and appearance.

Adrenals/Urinary Tract: Several small cysts are noted in both
kidneys. No masses identified. No evidence of hydronephrosis.

Stomach/Bowel: Visualized portion unremarkable.

Vascular/Lymphatic: No pathologically enlarged lymph nodes
identified. No acute vascular findings.

Other:  None.

Musculoskeletal:  No suspicious bone lesions identified.
IMPRESSION: Moderate diffuse hepatic steatosis.

Focal fatty sparing in the central left lobe corresponds to the
liver lesion seen on recent ultrasound. No evidence of hepatic mass.

No evidence of biliary or pancreatic ductal dilatation.

## 2023-02-28 ENCOUNTER — Ambulatory Visit
Admission: EM | Admit: 2023-02-28 | Discharge: 2023-02-28 | Disposition: A | Discharge status: Home / Self Care | Payer: Self-pay | Attending: Internal Medicine | Admitting: Internal Medicine

## 2023-02-28 ENCOUNTER — Ambulatory Visit (INDEPENDENT_AMBULATORY_CARE_PROVIDER_SITE_OTHER): Payer: Self-pay

## 2023-02-28 DIAGNOSIS — J329 Chronic sinusitis, unspecified: Secondary | ICD-10-CM

## 2023-02-28 DIAGNOSIS — R051 Acute cough: Secondary | ICD-10-CM

## 2023-02-28 DIAGNOSIS — J40 Bronchitis, not specified as acute or chronic: Secondary | ICD-10-CM

## 2023-02-28 DIAGNOSIS — R0602 Shortness of breath: Secondary | ICD-10-CM

## 2023-02-28 MED ORDER — BENZONATATE 100 MG PO CAPS: 100.0000 mg | ORAL_CAPSULE | Freq: Three times a day (TID) | ORAL | 0 refills | Status: AC

## 2023-02-28 MED ORDER — DOXYCYCLINE HYCLATE 100 MG PO CAPS: 100.0000 mg | ORAL_CAPSULE | Freq: Two times a day (BID) | ORAL | 0 refills | Status: AC

## 2023-02-28 MED ORDER — PREDNISONE 20 MG PO TABS: 40.0000 mg | ORAL_TABLET | Freq: Every day | ORAL | 0 refills | Status: AC

## 2023-02-28 MED ORDER — PROMETHAZINE-DM 6.25-15 MG/5ML PO SYRP: 2.5000 mL | ORAL_SOLUTION | Freq: Every evening | ORAL | 0 refills | Status: AC | PRN

## 2023-02-28 NOTE — Discharge Instructions (Addendum)
Your evaluation shows you have a bacterial sinus infection plus a viral infection of the upper airways of your lungs. Use the following medicines to help with your symptoms:  - Take antibiotic sent to pharmacy as directed to treat sinus infection. - Take steroid sent to pharmacy as directed starting tomorrow morning. Do not take any other NSAID containing medications such as ibuprofen or naproxen/Aleve while taking prednisone. - You may use albuterol inhaler 1 to 2 puffs every 4-6 hours as needed for cough, shortness of breath, and wheezing. - Tessalon perles every 8 hours as needed for cough. - Take Promethazine DM cough medication to help with your cough at nighttime so that you are able to sleep. Do not drive, drink alcohol, or go to work while taking this medication since it can make you sleepy. Only take this at nighttime.  - Purchase Mucinex over the counter and take this every 12 hours as needed for nasal congestion and cough.  If you develop any new or worsening symptoms or do not improve in the next 2 to 3 days, please return.  If your symptoms are severe, please go to the emergency room.  Follow-up with your primary care provider for further evaluation and management of your symptoms as well as ongoing wellness visits.  I hope you feel better!

## 2023-02-28 NOTE — ED Triage Notes (Signed)
Pt c/o malaise, fatigue, wheezing when laying down, coughing,   Onset ~ 2 weeks ago

## 2023-02-28 NOTE — ED Provider Notes (Signed)
[facility]    CSN: [id number] Arrival date & time: 02/28/23  1439      History   Chief Complaint Chief Complaint  Patient presents with   Cough    Upper respiratory congestion and wheezing - negative covid test - Entered by patient    HPI Theodore White is a 62 y.o. male.   Patient presents to urgent care for evaluation of ongoing cough, generalized fatigue, and generalized myalgias for the last 2 weeks. States he has not had a documented fever at home but has had chills and sweats intermittently. Cough is dry at first, then productive after he coughs for a long time and worse at nighttime. No persistent nasal congestion. Reports slight headache and bilateral chest discomfort associated with coughing. Chest discomfort goes away when he is not coughing. Denies shortness of breath, nausea, vomiting, abdominal pain, back pain, constipation, and diarrhea. States the cough has interrupted his sleep significantly. Denies orthopnea and leg swelling.  He states his wife is sick with similar symptoms but she has since recovered.  History of chronic kidney disease stage III and hypertension.  Denies history of chronic respiratory problems.  Has been using over-the-counter medications to help with symptoms without much relief.  No recent antipyretics.  Currently afebrile at 99.2.   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   OSA on CPAP 12/24/2015   Essential hypertension 10/22/2015   Pure hypercholesterolemia 10/08/2015   Chronic kidney disease due to hypertension 10/08/2015   Chronic kidney disease (CKD), stage III (moderate) 10/08/2015   Snores 10/08/2015   Awareness of heartbeats 10/07/2015   Chest pressure  10/07/2015   Acute idiopathic gout of left knee 04/18/2014   Tear of medial meniscus of left knee 04/18/2014   Septic arthritis of knee, left 04/17/2014    Past Surgical History:  Procedure Laterality Date   ASPIRATION BIOPSY Right 04/17/2014   ankle; "to assess swelling; they weren't able to aspirate anything though"   COLONOSCOPY     KNEE ARTHROSCOPY Left 04/17/2014   Procedure: ARTHROSCOPY KNEE with medial menisectomy, chondralplasty of patella-femoral joint, extensive synovectomy, aspiration of right ankle;  Surgeon: Harvie Junior, MD;  Location: [facility] OR;  Service: Orthopedics;  Laterality: Left;   KNEE ARTHROSCOPY WITH MEDIAL MENISECTOMY Left 04/17/2014   chondralplasty of patella-femoral joint, extensive synovectomy   TOOTH EXTRACTION     "just 2 teeth"       Home Medications    Prior to Admission medications   Medication Sig Start Date End Date Taking? Authorizing Provider  benzonatate (TESSALON) 100 MG capsule Take 1 capsule (100 mg total) by mouth every 8 (eight) hours. 02/28/23  Yes Carlisle Beers, FNP  doxycycline (VIBRAMYCIN) 100 MG capsule Take 1 capsule (100 mg total) by mouth 2 (two) times daily for 7 days. 02/28/23 03/07/23 Yes Avari Gelles, Donavan Burnet, FNP  predniSONE (DELTASONE) 20 MG tablet Take 2 tablets (40 mg total) by mouth daily for 5 days. 02/28/23 03/05/23 Yes Carlisle Beers, FNP  promethazine-dextromethorphan (PROMETHAZINE-DM) 6.25-15 MG/5ML syrup Take 2.5 mLs by mouth at bedtime as needed for cough. 02/28/23  Yes Carlisle Beers, FNP  allopurinol (ZYLOPRIM) 100  MG tablet TAKE 3 TABLETS (300MG  TOTAL) BY MOUTH EVERY DAY 11/27/19   Hilty, Lisette Abu, MD  benazepril (LOTENSIN) 10 MG tablet Take 10 mg by mouth daily.    [provider]  Fluticasone-Salmeterol (ADVAIR) 100-50 MCG/DOSE AEPB Inhale 1 puff into the lungs as needed (SOB).     [provider]  metoprolol tartrate (LOPRESSOR) 25 MG tablet Take 1 tablet (25 mg total) by mouth as  needed. 12/16/20 03/16/21  Chrystie Nose, MD  montelukast (SINGULAIR) 10 MG tablet Take 10 mg by mouth at bedtime.    [provider]  naproxen (NAPROSYN) 500 MG tablet Take 1 tablet (500 mg total) by mouth 2 (two) times daily. 12/15/20   Hall-Potvin, [place], PA-C    Family History Family History  Problem Relation Age of Onset   Asthma Mother    Heart disease Father    Sleep apnea Father    Prostate cancer Father    Healthy Brother    Drug abuse Brother    Healthy Brother     Social History Social History   Tobacco Use   Smoking status: Never   Smokeless tobacco: Never  Substance Use Topics   Alcohol use: No   Drug use: No     Allergies   Penicillins   Review of Systems Review of Systems  Respiratory:  Positive for cough.   Per HPI   Physical Exam Triage Vital Signs ED Triage Vitals [02/28/23 1620]  Enc Vitals Group     BP 110/75     Pulse Rate 97     Resp 18     Temp 99.2 F (37.3 C)     Temp Source Oral     SpO2 95 %     Weight      Height      Head Circumference      Peak Flow      Pain Score 0     Pain Loc      Pain Edu?      Excl. in GC?    No data found.  Updated Vital Signs BP 110/75 (BP Location: Right Arm)   Pulse 97   Temp 99.2 F (37.3 C) (Oral)   Resp 18   SpO2 95%   Visual Acuity Right Eye Distance:   Left Eye Distance:   Bilateral Distance:    Right Eye Near:   Left Eye Near:    Bilateral Near:     Physical Exam Vitals and nursing note reviewed.  Constitutional:      Appearance: He is not ill-appearing or toxic-appearing.  HENT:     Head: Normocephalic and atraumatic.     Right Ear: Hearing, tympanic membrane, ear canal and external ear normal.     Left Ear: Hearing, tympanic membrane, ear canal and external ear normal.     Nose: Congestion present.     Mouth/Throat:     Lips: Pink.     Mouth: Mucous membranes are moist. No injury.     Tongue: No lesions. Tongue does not deviate from midline.      Palate: No mass and lesions.     Pharynx: Oropharynx is clear. Uvula midline. No pharyngeal swelling, oropharyngeal exudate, posterior oropharyngeal erythema or uvula swelling.     Tonsils: No tonsillar exudate or tonsillar abscesses.  Eyes:     General: Lids are normal. Vision grossly intact. Gaze aligned appropriately.     Extraocular Movements: Extraocular movements intact.     Conjunctiva/sclera: Conjunctivae normal.  Cardiovascular:     Rate and Rhythm: Normal rate and regular rhythm.     Heart sounds: Normal heart sounds, S1 normal and S2 normal.  Pulmonary:     Effort: Pulmonary effort is normal. No respiratory distress.     Breath sounds: Normal air entry. Rhonchi present. No wheezing or rales.     Comments: Rhonchi to the bilateral lower lung fields to auscultation more prominent to the right than the left.  Speaking in full sentences without difficulty.  No respiratory distress. Abdominal:     General: Abdomen is flat. Bowel sounds are normal.     Palpations: Abdomen is soft.     Tenderness: There is no abdominal tenderness.  Musculoskeletal:     Cervical back: Neck supple.  Lymphadenopathy:     Cervical: Cervical adenopathy present.  Skin:    General: Skin is warm and dry.     Capillary Refill: Capillary refill takes less than 2 seconds.     Findings: No rash.  Neurological:     General: No focal deficit present.     Mental Status: He is alert and oriented to person, place, and time. Mental status is at baseline.     Cranial Nerves: No dysarthria or facial asymmetry.  Psychiatric:        Mood and Affect: Mood normal.        Speech: Speech normal.        Behavior: Behavior normal.        Thought Content: Thought content normal.        Judgment: Judgment normal.      UC Treatments / Results  Labs (all labs ordered are listed, but only abnormal results are displayed) Labs Reviewed - No data to display  EKG   Radiology DG Chest 2 View  Result Date:  02/28/2023 CLINICAL DATA:  Persistent cough for 2 weeks EXAM: CHEST - 2 VIEW COMPARISON:  Chest x-ray Apr 17, 2014 FINDINGS: The cardiomediastinal silhouette is unchanged in contour. Minimal scattered bronchial Frye thickening. No focal pulmonary opacity. No pleural effusion or pneumothorax. The visualized upper abdomen is unremarkable. No acute osseous abnormality. IMPRESSION: Evidence of small airway disease (i.e. reactive or viral) without superimposed pneumonia. Electronically Signed   By: Jacob Moores M.D.   On: 02/28/2023 17:31    Procedures Procedures (including critical care time)  Medications Ordered in UC Medications - No data to display  Initial Impression / Assessment and Plan / UC Course  I have reviewed the triage vital signs and the nursing notes.  Pertinent labs & imaging results that were available during my care of the patient were reviewed by me and considered in my medical decision making (see chart for details).   1.  Sinobronchitis, shortness of breath, acute cough Chest x-ray is negative for acute cardiopulmonary abnormality.  Patient is nontoxic in appearance with hemodynamically stable vital signs.  Will manage this is a sinobronchitis with symptoms present for greater than 10 days.  Doxycycline sent to be taken as prescribed for acute bacterial sinusitis that is likely postviral in nature.  Prednisone 40 mg once daily for the next 5 days to be taken with food every morning to avoid stomach upset.  No NSAID when taking prednisone due to increased risk of GI bleeding.  May use Tessalon Perles every 8 hours as needed for cough during the daytime and at bedtime.  May use Promethazine DM at bedtime as needed for cough, drowsiness precautions discussed.  May use Mucinex over-the-counter as needed  for nasal congestion and cough as well.  Advise follow-up with PCP in the next 1 to 2 weeks to ensure that symptoms have fully resolved.  ER and urgent care return precautions discussed.   Patient expresses understanding and agreement plan.   Discussed physical exam and available lab work findings in clinic with patient.  Counseled patient regarding appropriate use of medications and potential side effects for all medications recommended or prescribed today. Discussed red flag signs and symptoms of worsening condition,when to call the PCP office, return to urgent care, and when to seek higher level of care in the emergency department. Patient verbalizes understanding and agreement with plan. All questions answered. Patient discharged in stable condition.    Final Clinical Impressions(s) / UC Diagnoses   Final diagnoses:  Sinobronchitis  Shortness of breath  Acute cough     Discharge Instructions      Your evaluation shows you have a bacterial sinus infection plus a viral infection of the upper airways of your lungs. Use the following medicines to help with your symptoms:  - Take antibiotic sent to pharmacy as directed to treat sinus infection. - Take steroid sent to pharmacy as directed starting tomorrow morning. Do not take any other NSAID containing medications such as ibuprofen or naproxen/Aleve while taking prednisone. - You may use albuterol inhaler 1 to 2 puffs every 4-6 hours as needed for cough, shortness of breath, and wheezing. - Tessalon perles every 8 hours as needed for cough. - Take Promethazine DM cough medication to help with your cough at nighttime so that you are able to sleep. Do not drive, drink alcohol, or go to work while taking this medication since it can make you sleepy. Only take this at nighttime.  - Purchase Mucinex over the counter and take this every 12 hours as needed for nasal congestion and cough.  If you develop any new or worsening symptoms or do not improve in the next 2 to 3 days, please return.  If your symptoms are severe, please go to the emergency room.  Follow-up with your primary care provider for further evaluation and management  of your symptoms as well as ongoing wellness visits.  I hope you feel better!      ED Prescriptions     Medication Sig Dispense Auth. Provider   doxycycline (VIBRAMYCIN) 100 MG capsule Take 1 capsule (100 mg total) by mouth 2 (two) times daily for 7 days. 14 capsule Reita MayStanhope, Elowyn Raupp M, FNP   predniSONE (DELTASONE) 20 MG tablet Take 2 tablets (40 mg total) by mouth daily for 5 days. 10 tablet Reita MayStanhope, Aeryn Medici M, FNP   benzonatate (TESSALON) 100 MG capsule Take 1 capsule (100 mg total) by mouth every 8 (eight) hours. 21 capsule Carlisle BeersStanhope, Chares Slaymaker M, FNP   promethazine-dextromethorphan (PROMETHAZINE-DM) 6.25-15 MG/5ML syrup Take 2.5 mLs by mouth at bedtime as needed for cough. 118 mL Carlisle BeersStanhope, Takira Sherrin M, FNP      PDMP not reviewed this encounter.   Carlisle BeersStanhope, Colter Magowan M, OregonFNP 02/28/23 [id number]

## 2023-05-28 ENCOUNTER — Ambulatory Visit
Admission: EM | Admit: 2023-05-28 | Discharge: 2023-05-28 | Disposition: A | Discharge status: Home / Self Care | Payer: Managed Care, Other (non HMO) | Attending: Internal Medicine | Admitting: Internal Medicine

## 2023-05-28 ENCOUNTER — Ambulatory Visit: Payer: Self-pay

## 2023-05-28 ENCOUNTER — Other Ambulatory Visit: Payer: Self-pay | Admitting: Internal Medicine

## 2023-05-28 DIAGNOSIS — R000 Tachycardia, unspecified: Secondary | ICD-10-CM | POA: Insufficient documentation

## 2023-05-28 DIAGNOSIS — N189 Chronic kidney disease, unspecified: Secondary | ICD-10-CM | POA: Insufficient documentation

## 2023-05-28 DIAGNOSIS — R5383 Other fatigue: Secondary | ICD-10-CM | POA: Insufficient documentation

## 2023-05-28 DIAGNOSIS — Z1152 Encounter for screening for COVID-19: Secondary | ICD-10-CM | POA: Insufficient documentation

## 2023-05-28 DIAGNOSIS — Z8679 Personal history of other diseases of the circulatory system: Secondary | ICD-10-CM | POA: Insufficient documentation

## 2023-05-28 DIAGNOSIS — I429 Cardiomyopathy, unspecified: Secondary | ICD-10-CM | POA: Diagnosis not present

## 2023-05-28 DIAGNOSIS — I129 Hypertensive chronic kidney disease with stage 1 through stage 4 chronic kidney disease, or unspecified chronic kidney disease: Secondary | ICD-10-CM | POA: Insufficient documentation

## 2023-05-28 DIAGNOSIS — R5381 Other malaise: Secondary | ICD-10-CM | POA: Insufficient documentation

## 2023-05-28 DIAGNOSIS — Z79899 Other long term (current) drug therapy: Secondary | ICD-10-CM | POA: Diagnosis not present

## 2023-05-28 HISTORY — DX: Cardiomyopathy, unspecified: I42.9

## 2023-05-28 HISTORY — DX: Supraventricular tachycardia, unspecified: I47.10

## 2023-05-28 LAB — POCT URINALYSIS DIP (MANUAL ENTRY)
Bilirubin, UA: NEGATIVE
Blood, UA: NEGATIVE
Glucose, UA: NEGATIVE mg/dL
Ketones, POC UA: NEGATIVE mg/dL
Leukocytes, UA: NEGATIVE
Nitrite, UA: NEGATIVE
Protein Ur, POC: NEGATIVE mg/dL
Spec Grav, UA: 1.015 (ref 1.010–1.025)
Urobilinogen, UA: 0.2 E.U./dL
pH, UA: 6 (ref 5.0–8.0)

## 2023-05-28 MED ORDER — LOPERAMIDE HCL 2 MG PO CAPS: 2.0000 mg | ORAL_CAPSULE | Freq: Two times a day (BID) | ORAL | 0 refills | Status: AC | PRN

## 2023-05-28 MED ORDER — ONDANSETRON 4 MG PO TBDP: 4.0000 mg | ORAL_TABLET | Freq: Three times a day (TID) | ORAL | 0 refills | Status: AC | PRN

## 2023-05-28 MED ORDER — METOPROLOL TARTRATE 25 MG PO TABS: 25.0000 mg | ORAL_TABLET | Freq: Two times a day (BID) | ORAL | 0 refills | Status: AC | PRN

## 2023-05-28 NOTE — Discharge Instructions (Addendum)
We will notify you of your test results as they arrive and may take between about 24 hours.  I encourage you to sign up for MyChart if you have not already done so as this can be the easiest way for [place] to communicate results to you online or through a phone app.  Generally, we only contact you if it is a positive test result.  In the meantime, if you develop worsening symptoms including fever, chest pain, shortness of breath despite our current treatment plan then please report to the emergency room as this may be a sign of worsening status from possible viral infection.  Otherwise, we will manage this as a viral syndrome. For sore throat or cough try using a honey-based tea. Use 3 teaspoons of honey with juice squeezed from half lemon. Place shaved pieces of ginger into 1/2-1 cup of water and warm over stove top. Then mix the ingredients and repeat every 4 hours as needed. Please take Tylenol 650mg  every 6 hours for aches and pains, fevers. Hydrate very well with at least 2 liters of water. Eat light meals such as soups to replenish electrolytes and soft fruits, veggies. Start an antihistamine like Zyrtec (10mg  daily) for postnasal drainage, sinus congestion.  Use ondansetron for nausea and vomiting, loperamide for diarrhea. I refilled metoprolol 25mg  twice daily as needed.

## 2023-05-28 NOTE — ED Notes (Addendum)
error 

## 2023-05-28 NOTE — ED Provider Notes (Signed)
[facility]  Note:  This document was prepared using Conservation officer, historic buildings and may include unintentional dictation errors.  MRN: [id number] DOB: [birth year]  Subjective:   Theodore White is a 62 y.o. male presenting for 1 day history of malaise, fatigue.  Primary concern was that his pulse was severely elevated last night of 190s bpm.  Has a history of supraventricular tachycardia, unspecified cardiomyopathy, hypertension and CKD.  Needs a refill of his metoprolol as it is outdated.  Has not had follow-up with his cardiologist since 2022.  Patient and his wife report that they were able to get his heart rate under control last night.  However, today he continued to have malaise, fatigue and ended up having vomiting and diarrhea.  Yesterday [place] spent the day in [place] but did not do anything particularly strenuous or out in the heat.  No overt chest pain, shortness of breath, diaphoresis, bloody stools and has had some mild upper abdominal pain.  No fever, recent antibiotic use, hospitalizations or long distance travel.  Has not eaten raw foods, drank unfiltered water.  No history of GI disorders including Crohn's, IBS, ulcerative colitis.  No alcohol use.  No drug use.  No smoking.  No current facility-administered medications for this encounter.  Current Outpatient Medications:    allopurinol (ZYLOPRIM) 100 MG tablet, TAKE 3 TABLETS (300MG  TOTAL) BY MOUTH EVERY DAY, Disp: 90 tablet, Rfl: 0   benazepril (LOTENSIN) 10 MG tablet, Take 10 mg by mouth daily., Disp: , Rfl:    benzonatate (TESSALON) 100 MG capsule, Take 1 capsule (100 mg total) by mouth every 8 (eight) hours., Disp: 21 capsule, Rfl: 0   Fluticasone-Salmeterol (ADVAIR) 100-50 MCG/DOSE AEPB, Inhale 1 puff into the lungs as needed (SOB). , Disp: , Rfl:    metoprolol tartrate (LOPRESSOR) 25 MG tablet, Take 1 tablet (25 mg total) by mouth as needed., Disp: 30 tablet, Rfl: 0   montelukast (SINGULAIR) 10 MG  tablet, Take 10 mg by mouth at bedtime., Disp: , Rfl:    naproxen (NAPROSYN) 500 MG tablet, Take 1 tablet (500 mg total) by mouth 2 (two) times daily., Disp: 30 tablet, Rfl: 0   promethazine-dextromethorphan (PROMETHAZINE-DM) 6.25-15 MG/5ML syrup, Take 2.5 mLs by mouth at bedtime as needed for cough., Disp: 118 mL, Rfl: 0   Allergies  Allergen Reactions   Penicillins Other (See Comments)    Had med as a child doesn't know the reaction    Past Medical History:  Diagnosis Date   Arthritis    "knees and ankles" (04/18/2014)   Asthma    Cardiomyopathy (HCC)    Chronic kidney disease    stage 3 renal failure under control   Gout    Hypertension    SVT (supraventricular tachycardia)      Past Surgical History:  Procedure Laterality Date   ASPIRATION BIOPSY Right 04/17/2014   ankle; "to assess swelling; they weren't able to aspirate anything though"   COLONOSCOPY     KNEE ARTHROSCOPY Left 04/17/2014   Procedure: ARTHROSCOPY KNEE with medial menisectomy, chondralplasty of patella-femoral joint, extensive synovectomy, aspiration of right ankle;  Surgeon: Harvie Junior, MD;  Location: [facility] OR;  Service: Orthopedics;  Laterality: Left;   KNEE ARTHROSCOPY WITH MEDIAL MENISECTOMY Left 04/17/2014   chondralplasty of patella-femoral joint, extensive synovectomy   TOOTH EXTRACTION     "just 2 teeth"    Family History  Problem Relation Age of Onset   Asthma Mother    Heart  disease Father    Sleep apnea Father    Prostate cancer Father    Healthy Brother    Drug abuse Brother    Healthy Brother     Social History   Tobacco Use   Smoking status: Never   Smokeless tobacco: Never  Substance Use Topics   Alcohol use: No   Drug use: No    ROS   Objective:   Vitals: BP 115/77 (BP Location: Left Arm)   Pulse 98   Temp 99.9 F (37.7 C) (Oral)   Resp 17   SpO2 92%   Physical Exam Constitutional:      General: He is not in acute distress.    Appearance: Normal appearance. He is  well-developed and normal weight. He is not ill-appearing, toxic-appearing or diaphoretic.  HENT:     Head: Normocephalic and atraumatic.     Right Ear: External ear normal.     Left Ear: External ear normal.     Nose: Nose normal.     Mouth/Throat:     Mouth: Mucous membranes are moist.     Pharynx: No pharyngeal swelling, oropharyngeal exudate, posterior oropharyngeal erythema or uvula swelling.     Tonsils: No tonsillar exudate or tonsillar abscesses. 0 on the right. 0 on the left.  Eyes:     General: No scleral icterus.       Right eye: No discharge.        Left eye: No discharge.     Extraocular Movements: Extraocular movements intact.     Conjunctiva/sclera: Conjunctivae normal.  Cardiovascular:     Rate and Rhythm: Normal rate and regular rhythm.     Heart sounds: Normal heart sounds. No murmur heard.    No friction rub. No gallop.  Pulmonary:     Effort: Pulmonary effort is normal. No respiratory distress.     Breath sounds: Normal breath sounds. No stridor. No wheezing, rhonchi or rales.  Abdominal:     General: Bowel sounds are normal. There is no distension.     Palpations: Abdomen is soft. There is no mass.     Tenderness: There is no abdominal tenderness. There is no right CVA tenderness, left CVA tenderness, guarding or rebound.  Musculoskeletal:     Cervical back: Normal range of motion.  Skin:    General: Skin is warm and dry.  Neurological:     Mental Status: He is alert and oriented to person, place, and time.  Psychiatric:        Mood and Affect: Mood normal.        Behavior: Behavior normal.        Thought Content: Thought content normal.        Judgment: Judgment normal.     Results for orders placed or performed during the hospital encounter of 05/28/23 (from the past 24 hour(s))  POCT urinalysis dipstick     Status: None   Collection Time: 05/28/23 12:31 PM  Result Value Ref Range   Color, UA yellow yellow   Clarity, UA clear clear   Glucose, UA  negative negative mg/dL   Bilirubin, UA negative negative   Ketones, POC UA negative negative mg/dL   Spec Grav, UA 1.308 6.578 - 1.025   Blood, UA negative negative   pH, UA 6.0 5.0 - 8.0   Protein Ur, POC negative negative mg/dL   Urobilinogen, UA 0.2 0.2 or 1.0 E.U./dL   Nitrite, UA Negative Negative   Leukocytes, UA Negative Negative   ED  ECG REPORT   Date: 05/28/2023  EKG Time: 2:25 PM  Rate: 89 bpm  Rhythm: normal sinus rhythm,  unchanged from previous tracings  Axis: Left  Intervals:none  ST&T Change: T wave inversion in inferior leads I, 2, lateral leads aVL, V4, V5, V6  Narrative Interpretation: Sinus rhythm at 89 bpm with T wave changes as above.  Very comparable to previous EKGs.   Assessment and Plan :   PDMP not reviewed this encounter.  1. Tachycardia   2. Malaise and fatigue   3. History of supraventricular tachycardia    EKG is without acute findings, acute changes and very comparable to previous.  Patient also does not endorse chest pain.  His pulse has been controlled as well.  Patient's wife reports that normally he runs in the 80s and 90s.  Metoprolol refilled.  Recommended Zofran and loperamide for supportive care, possible developing colitis, gastroenteritis.  Otherwise, Will manage for viral illness such as viral URI, viral syndrome, viral rhinitis, COVID-19. Recommended supportive care. Offered scripts for symptomatic relief. Testing is pending. Counseled patient on potential for adverse effects with medications prescribed/recommended today, ER and return-to-clinic precautions discussed, patient verbalized understanding.  Recommend close follow-up with their cardiologist.    Wallis Bamberg, PA-C 05/28/23 1427

## 2023-05-28 NOTE — ED Triage Notes (Addendum)
Pt presents with c/o elevated HR. Last night it was up at 190's. States his HR fluctuates. States this morning he had diarrhea and was vomiting.   Pt denies chest pain, headaches, he had mediterranean food and added hot sauce to it. Pt states he ate around 8pm and started to feel discomfort hours later.   Reports he took metoprolol last night and this morning. Has been able to hold down fluids. Pt states he is not in pain.

## 2023-05-29 LAB — SARS CORONAVIRUS 2 (TAT 6-24 HRS): SARS Coronavirus 2: NEGATIVE

## 2023-06-02 NOTE — Progress Notes (Signed)
Cardiology Clinic Note   Patient Name: Theodore White Date of Encounter: 06/03/2023  Primary Care Provider:  Irven Coe, MD Primary Cardiologist:  Chrystie Nose, MD  Patient Profile    Theodore White 62 year old male presents to the clinic today for follow-up evaluation of his tachycardia.  Past Medical History    Past Medical History:  Diagnosis Date   Arthritis    "knees and ankles" (04/18/2014)   Asthma    Cardiomyopathy (HCC)    Chronic kidney disease    stage 3 renal failure under control   Gout    Hypertension    SVT (supraventricular tachycardia)    Past Surgical History:  Procedure Laterality Date   ASPIRATION BIOPSY Right 04/17/2014   ankle; "to assess swelling; they weren't able to aspirate anything though"   COLONOSCOPY     KNEE ARTHROSCOPY Left 04/17/2014   Procedure: ARTHROSCOPY KNEE with medial menisectomy, chondralplasty of patella-femoral joint, extensive synovectomy, aspiration of right ankle;  Surgeon: Harvie Junior, MD;  Location: [facility] OR;  Service: Orthopedics;  Laterality: Left;   KNEE ARTHROSCOPY WITH MEDIAL MENISECTOMY Left 04/17/2014   chondralplasty of patella-femoral joint, extensive synovectomy   TOOTH EXTRACTION     "just 2 teeth"    Allergies  Allergies  Allergen Reactions   Penicillins Other (See Comments)    Had med as a child doesn't know the reaction    History of Present Illness    Theodore White has a PMH of essential hypertension atypical variant hypertrophic cardiomyopathy, OSA on CPAP, CKD stage III, and hypercholesterolemia.   He was initially referred to Dr. Rennis Golden for an evaluation of his palpitations and chest pain.  He had reported a feeling of lightheadedness 3 weeks prior to his initial visit.  The event was followed by an episode of heart racing, diaphoresis and right-sided pressure.  During the episode his daughters who are nurses felt his heart rate was close to 200.  The episode lasted 10 to 15 minutes and resolve  spontaneously.  His EKG showed significant LVH by voltage and T wave inversions in leads V3 through V6 as well as 1 and aVL.  A stress test at that time showed an EF of 55-65% no significant deviation and was low risk.  Echocardiogram showed LVEF 60-65% G1 DD, and no other significant abnormalities.  He underwent cardiac MRI which showed an EF of 61% asymmetric hypertrophy of the apical LV segments and the true apex.  Overall suggestive of apical variant hypertrophic cardiomyopathy.  Not felt to have prior MI.  He was informed about his type of apical variant rarely leads to significant arrhythmias, sudden death or outflow tract obstruction.  Continued monitoring was suggested.   He was last seen by Dr. Rennis Golden 03/15/2018.  During that time he was doing well.  He had lost a significant amount of weight however he had gained it back.  His weight at that time was around 240 pounds.  He denied chest pain and shortness of breath.  He was on low-dose Benzapril and his blood pressure was well controlled.  He denied palpitations.  Cholesterol 4/18 showed an LDL of 100.   09/10/20 had an episode of SVT, EMT called. Resolved  with vagal maneuver and did not have to present to the hospital.    He presented to the 09/15/20  for follow-up evaluation stated he and his wife were at an [employer] football game.  He went into SVT.  He indicated that  this was the first time this had happened in almost 2 years.  He attempted his own vagal maneuvers (coughing and bearing down).  These did not convert him back to sinus rhythm so he presented to the medical tent.  He was evaluated by EMTs and they performed a chest maneuver.  This converted him back to normal sinus rhythm and he was instructed to present to cardiology.  He had no further episodes/events.  He stated that he had some increased work of breathing with physical activity but attributed it to increased weight gain and lack of conditioning.  We discussed vagal maneuvers and  adenosine treatment.  He also discussed the lack of compliance with CPAP.  He stated that his mask was not fitting well.  I recommended he present back to sleep medicine for further evaluation and a different mask.  Him and his wife expressed understanding.    He was seen in follow-up by Dr. Rennis Golden on 12/16/2020.  He reported that he was doing well.  He had gained weight and was about 274 pounds.  He had been down to 240 pounds in 2019.  He continued to be noncompliant with his CPAP.  He was prescribed low-dose metoprolol as needed.  He was encouraged to use his CPAP.  His blood pressure was well-controlled.  His EKG showed stable lateral T wave inversion consistent with known apical variant hypertrophic cardiomyopathy follow-up was planned for 1 year.  He presented to the emergency department 05/28/2023.  He reported a 1 day history of malaise and fatigue.  He was concerned about elevated pulse rate.  He reported pulse in the 190s the prior night.  His metoprolol was outdated.  He had been able to get his heart rate under control the prior night.  He also reported vomiting and diarrhea.  He had reported travel to [place].  They had not done anything strenuous or been out in the heat.  He denied chest pain and shortness of breath.  He denied fever and recent antibiotic use or hospitalizations.  He denied raw food consumption, drinking contaminated water, or history of GI disorder.  He denied alcohol use and drug use.  His COVID swab was negative.  His blood pressure was 115/77 and his pulse was noted to be 98.  His EKG showed normal sinus rhythm and was unchanged from prior tracing.  He denied chest pain.  His metoprolol was refilled.  It was felt that he may be developing colitis or gastroenteritis.  It was felt that he had contracted a viral illness and supportive treatment was recommended.  Follow-up with cardiology was recommended.  He presents to the clinic today for follow-up evaluation and states prior to  going to the emergency department he had a bout of vomiting and diarrhea.  He had also been [place] 3 weeks prior and had an episode of fast heart rate that was corrected with knee-to-chest maneuver.  He was able to walk around [place] and did not have repeat episodes of accelerated heart rate or need to take metoprolol.  We reviewed his most recent emergency department visit.  I discussed the possibility of needing cardiac event monitor and scheduled dosing of metoprolol.  We used shared decision-making to decide to defer cardiac event monitor at this time.  I instructed him to contact the office with further episodes of palpitations.  He and his wife expressed understanding.  He has a follow-up already scheduled in November.  We will keep that appointment and  have him avoid triggers for palpitations.  I also recommended that he follow-up with his dentist to discuss options for oral appliance for management of his OSA.  He continues to be noncompliant with the CPAP.  He is unable to wear facemask due to claustrophobic type feelings.  Today he denies chest pain, shortness of breath, lower extremity edema, fatigue, palpitations, melena, hematuria, hemoptysis, diaphoresis, weakness, presyncope, syncope, orthopnea, and PND.     Home Medications    Prior to Admission medications   Medication Sig Start Date End Date Taking? Authorizing Provider  allopurinol (ZYLOPRIM) 100 MG tablet TAKE 3 TABLETS (300MG  TOTAL) BY MOUTH EVERY DAY 11/27/19   Hilty, Lisette Abu, MD  benazepril (LOTENSIN) 10 MG tablet Take 10 mg by mouth daily.    [provider]  benzonatate (TESSALON) 100 MG capsule Take 1 capsule (100 mg total) by mouth every 8 (eight) hours. 02/28/23   Carlisle Beers, FNP  Fluticasone-Salmeterol (ADVAIR) 100-50 MCG/DOSE AEPB Inhale 1 puff into the lungs as needed (SOB).     [provider]  loperamide (IMODIUM) 2 MG capsule Take 1 capsule (2 mg total) by mouth 2 (two) times  daily as needed for diarrhea or loose stools. 05/28/23   Wallis Bamberg, PA-C  metoprolol tartrate (LOPRESSOR) 25 MG tablet Take 1 tablet (25 mg total) by mouth 2 (two) times daily as needed. 05/28/23   Wallis Bamberg, PA-C  montelukast (SINGULAIR) 10 MG tablet Take 10 mg by mouth at bedtime.    [provider]  naproxen (NAPROSYN) 500 MG tablet Take 1 tablet (500 mg total) by mouth 2 (two) times daily. 12/15/20   Hall-Potvin, [place], PA-C  ondansetron (ZOFRAN-ODT) 4 MG disintegrating tablet Take 1 tablet (4 mg total) by mouth every 8 (eight) hours as needed for nausea or vomiting. 05/28/23   Wallis Bamberg, PA-C  promethazine-dextromethorphan (PROMETHAZINE-DM) 6.25-15 MG/5ML syrup Take 2.5 mLs by mouth at bedtime as needed for cough. 02/28/23   Carlisle Beers, FNP    Family History    Family History  Problem Relation Age of Onset   Asthma Mother    Heart disease Father    Sleep apnea Father    Prostate cancer Father    Healthy Brother    Drug abuse Brother    Healthy Brother    He indicated that his mother is alive. He indicated that his father is alive. He indicated that both of his brothers are alive.  Social History    Social History   Socioeconomic History   Marital status: Married    Spouse name: Not on file   Number of children: Not on file   Years of education: Not on file   Highest education level: Not on file  Occupational History   Not on file  Tobacco Use   Smoking status: Never   Smokeless tobacco: Never  Substance and Sexual Activity   Alcohol use: No   Drug use: No   Sexual activity: Yes  Other Topics Concern   Not on file  Social History Narrative   Not on file   Social Determinants of Health   Financial Resource Strain: Not on file  Food Insecurity: Not on file  Transportation Needs: Not on file  Physical Activity: Not on file  Stress: Not on file  Social Connections: Not on file  Intimate Partner Violence: Not on file     Review of Systems     General:  No chills, fever, night sweats or weight changes.  Cardiovascular:  No chest pain, dyspnea on exertion, edema, orthopnea, palpitations, paroxysmal nocturnal dyspnea. Dermatological: No rash, lesions/masses Respiratory: No cough, dyspnea Urologic: No hematuria, dysuria Abdominal:   No nausea, vomiting, diarrhea, bright red blood per rectum, melena, or hematemesis Neurologic:  No visual changes, wkns, changes in mental status. All other systems reviewed and are otherwise negative except as noted above.  Physical Exam    VS:  BP 138/82 (BP Location: Left Arm, Patient Position: Sitting, Cuff Size: Normal)   Pulse 62   Ht 6\' 1"  (1.854 m)   Wt 273 lb 9.6 oz (124.1 kg)   SpO2 95%   BMI 36.10 kg/m  , BMI Body mass index is 36.1 kg/m. GEN: Well nourished, well developed, in no acute distress. HEENT: normal. Neck: Supple, no JVD, carotid bruits, or masses. Cardiac: RRR, no murmurs, rubs, or gallops. No clubbing, cyanosis, edema.  Radials/DP/PT 2+ and equal bilaterally.  Respiratory:  Respirations regular and unlabored, clear to auscultation bilaterally. GI: Soft, nontender, nondistended, BS + x 4. MS: no deformity or atrophy. Skin: warm and dry, no rash. Neuro:  Strength and sensation are intact. Psych: Normal affect.  Accessory Clinical Findings    Recent Labs: No results found for requested labs within last 365 days.   Recent Lipid Panel No results found for: "CHOL", "TRIG", "HDL", "CHOLHDL", "VLDL", "LDLCALC", "LDLDIRECT"       ECG personally reviewed by me today- none today.  EKG 05/28/23 Normal sinus rhythm ST and T wave abnormality 89 bpm  EKG 09/15/2020 normal sinus rhythm left ventricular hypertrophy with repolarization abnormality 66 bpm no acute changes.   Echocardiogram 12/01/2017  Study Conclusions   - Left ventricle: The cavity size was normal. Systolic function was    normal. The estimated ejection fraction was in the range of 60%    to 65%. Porta  motion was normal; there were no regional Hoos    motion abnormalities. Left ventricular diastolic function    parameters were normal.   Impressions:   - Apical [place] thickness appears qualitatively mildly increased, but    there is no evidence of apical gradient by color Doppler or CW    Doppler and there is no evidence of apical entrapment or aneurysm    formation on Definity images. Overall, no convining evidence for    apical variant hypertrophic cardiomyopathy. Cardiac CT or MRI may    be superior for this diagnosis, but use of these techniques may    be limited by renal disease.       Assessment & Plan   1.  SVT, tachycardia -heart rate today 62 bpm.  Had episode of elevated heart rate and presented to the emergency department on 05/28/2023.  His episode was felt to be related to viral illness.  EKG showed sinus rhythm with a rate of 89 and no changes compared to previous tracing.   Continue metoprolol as needed Avoid triggers-reviewed Contact office with further episodes, may order cardiac event monitor   Palpitations-intermittent brief episodes as he recovers from viral infection Heart healthy low-sodium diet Increase physical activity as tolerated Maintain p.o. hydration  Hypertrophic cardiomyopathy-notes some DOE or activity intolerance but feels it is related to weight gain and lack of conditioning.  Cardiac MRI 2017 showed apical variant which was not believed to be related to prior MI.  Echocardiogram 12/01/2017 showed normal LVEF, normal diastolic parameters and no significant valvular abnormalities. Continue to monitor    Essential hypertension-BP today 138/82.   Continue Benzapril Heart healthy  low-sodium diet-salty 6 given Increase physical activity as tolerated   OSA-reports noncompliance with CPAP.  Unable to tolerate facemask due to feelings of claustrophobia. Follow-up with dentist for consideration for dental appliance.   Disposition: Follow-up with Dr. Rennis Golden  or me in November.  Thomasene Ripple. Finnean Cerami NP-C     06/03/2023, 11:41 AM [facility] [address] Office [phone] Fax [phone]    I spent 14 minutes examining this patient, reviewing medications, and using patient centered shared decision making involving her cardiac care.  Prior to her visit I spent greater than 20 minutes reviewing her past medical history,  medications, and prior cardiac tests.

## 2023-06-03 ENCOUNTER — Ambulatory Visit: Payer: Managed Care, Other (non HMO) | Attending: General Practice | Admitting: General Practice

## 2023-06-03 ENCOUNTER — Encounter: Payer: Self-pay | Admitting: General Practice

## 2023-06-03 VITALS — BP 138/82 | HR 62 | Ht 73.0 in | Wt 273.6 lb

## 2023-06-03 DIAGNOSIS — I10 Essential (primary) hypertension: Secondary | ICD-10-CM

## 2023-06-03 DIAGNOSIS — R000 Tachycardia, unspecified: Secondary | ICD-10-CM

## 2023-06-03 DIAGNOSIS — I422 Other hypertrophic cardiomyopathy: Secondary | ICD-10-CM | POA: Diagnosis not present

## 2023-06-03 DIAGNOSIS — R002 Palpitations: Secondary | ICD-10-CM

## 2023-06-03 DIAGNOSIS — I471 Supraventricular tachycardia, unspecified: Secondary | ICD-10-CM

## 2023-06-03 NOTE — Patient Instructions (Addendum)
Medication Instructions:  Your physician recommends that you continue on your current medications as directed. Please refer to the Current Medication list given to you today.  *If you need a refill on your cardiac medications before your next appointment, please call your pharmacy*    Follow-Up: At [facility], you and your health needs are our priority.  As part of our continuing mission to provide you with exceptional heart care, we have created designated Provider Care Teams.  These Care Teams include your primary Cardiologist (physician) and Advanced Practice Providers (APPs -  Physician Assistants and Nurse Practitioners) who all work together to provide you with the care you need, when you need it.  We recommend signing up for the patient portal called "MyChart".  Sign up information is provided on this After Visit Summary.  MyChart is used to connect with patients for Virtual Visits (Telemedicine).  Patients are able to view lab/test results, encounter notes, upcoming appointments, etc.  Non-urgent messages can be sent to your provider as well.   To learn more about what you can do with MyChart, go to [email or web address].    Your next appointment:   Monday, November 18th @ 3:30p  Provider:   Chrystie Nose, MD     Other Instructions Please let us know if you have another episode of palpitations.  Please avoid triggers that include alcohol and caffeine. Try to stay hydrated- 65-80+ fluid ounces of water or unsweet tea for healthy kidneys

## 2023-10-10 ENCOUNTER — Ambulatory Visit: Payer: Managed Care, Other (non HMO) | Attending: Internal Medicine | Admitting: Internal Medicine

## 2023-10-10 ENCOUNTER — Encounter: Payer: Self-pay | Admitting: Internal Medicine

## 2023-10-10 VITALS — BP 122/80 | HR 82 | Ht 73.0 in | Wt 272.0 lb

## 2023-10-10 DIAGNOSIS — I10 Essential (primary) hypertension: Secondary | ICD-10-CM

## 2023-10-10 DIAGNOSIS — I422 Other hypertrophic cardiomyopathy: Secondary | ICD-10-CM | POA: Diagnosis not present

## 2023-10-10 DIAGNOSIS — I471 Supraventricular tachycardia, unspecified: Secondary | ICD-10-CM | POA: Diagnosis not present

## 2023-10-10 NOTE — Progress Notes (Unsigned)
OFFICE NOTE  Chief Complaint:  No complaints  Primary Care Physician: Irven Coe, MD  HPI:  Theodore White is a 62 year old male patient who is referred to me for evaluation of palpitations and chest pain. He reports about 3 weeks ago while eating a meal he was noted to have a feeling of a wave of lightheadedness go across his head. This was followed by racing of his heart, diaphoresis and some right sided chest pressure. Apparently both of his daughters are nurses and they were able to put her hand on his chest and guesstimated his heart rate was possibly close to 200. This lasted apparently for about 10-15 minutes and then resolve spontaneously. He had a similar episode perhaps about 6 months ago but that did not last but a few minutes. He has not reported any worsening shortness of breath or chest pain with exertion, but is been fairly sedentary since his knee surgery about a year ago. He does have cardiac risk factors including hypertension and reportedly has stage III chronic kidney disease secondary to hypertension, but he is only on a low-dose of benazepril and blood pressure is fairly well-controlled. His creatinine most recently was 1.32 which is not significantly reduced given his age. Nevertheless, he does have an abnormal EKG which shows significant LVH by voltage and T-wave inversions which are deep in leads V3 through V6 as well as 1 and aVL and lead 2. He did fill out a sleep questionnaire which indicate significant chance of dosing with a sleepiness score greater than 10. His wife reports that he is a snorer and has been noted to stop breathing at times. He does feel daytime fatigue and somnolence and has a thick neck.  [name] returns today for follow-up. He reports feeling fairly well. He denies any chest pain or shortness of breath. He did undergo a sleep study last evening and felt somewhat tired today. He says that they stop the study part way and fit him with a mask, but he  seemed to be intolerant of it. We do not have a final report but it suggests that he does have some sleep apnea. I encouraged him to be open minded about wearing a CPAP as it may be beneficial for him. His wife reiterated that he is been snoring and having apneic episodes for 20-30 years she's known him. With regards to his testing, he underwent nuclear stress testing as well as an echocardiogram. The results are below:  The left ventricular ejection fraction is normal (55-65%). Nuclear stress EF: 56%. There was no ST segment deviation noted during stress. This is a low risk study.   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  Study Conclusions  - Procedure narrative: Transthoracic echocardiography. Image   quality was adequate. The study was technically difficult, as a   result of poor acoustic windows. Intravenous contrast (Definity)   was administered. - Left ventricle: The cavity size was normal. There was mild   concentric hypertrophy. Systolic function was normal. The   estimated ejection fraction was in the range of 60% to 65%. Bensch   motion was normal; there were no regional Friedhoff motion   abnormalities. Doppler parameters are consistent with abnormal   left ventricular relaxation (grade 1 diastolic dysfunction). - Aortic valve: There was no regurgitation. - Aortic root: The aortic root  was normal in size. - Ascending aorta: The ascending aorta was normal in size. - Mitral valve: There was no regurgitation. - Left atrium: The atrium was moderately dilated. - Right ventricle: Systolic function was normal. - Right atrium: The atrium was normal in size. - Tricuspid valve: There was no regurgitation. - Pulmonic valve: There was no regurgitation. - Pulmonary arteries: Systolic pressure was  within the normal   range. - Inferior vena cava: The vessel was normal in size. - Pericardium, extracardiac: There was no pericardial effusion.  I discussed the results with him as well as the fact that there are discrepancies. The nuclear stress test suggests there is a fixed anterior Milholland defect concerning for prior infarct as well as significant apical uptake which may be related to apical hypertrophic cardiomyopathy. Interestingly, the LVEF is normal and the anterior Piacente motion is normal. The echocardiogram also showed normal anterior Pintor motion without any evidence of scar however the study was limited and required contrast to get decent images. There is certainly discrepancy between both studies with equivocal findings. His EKG showing deep anterior and lateral T-wave inversions could suggest both ischemia, scar or apical hypertrophic cardiomyopathy. Is not certain whether the tests have answer the question of what the cause of his abnormal EKG changes are.  [name] returns today for follow-up of his cardiac MRI, the results are below:  EXAM: CARDIAC MRI   TECHNIQUE: The patient was scanned on a 1.5 Tesla GE magnet. A dedicated cardiac coil was used. Functional imaging was done using Fiesta sequences. 2,3, and 4 chamber views were done to assess for RWMA's. Modified Simpson's rule using a short axis stack was used to calculate an ejection fraction on a dedicated work [employer]. The patient received 35 cc of Multihance. After 10 minutes inversion recovery sequences were used to assess for infiltration and scar tissue.   CONTRAST:  35 cc Multihance   FINDINGS: Limited images of the lung fields showed no significant abnormalities.   Normal left ventricular size and systolic function, EF 61%. There was asymmetric hypertrophy of the apical LV segments and the true apex. Not marked, but there was a clear difference compared to the mid and basal LV segments.  Normal Mccalla motion. Normal right ventricular size and systolic function. Mild left atrial enlargement. Normal right atrium. Trileaflet aortic valve with no stenosis or regurgitation. Trivial mitral regurgitation.   On delayed enhancement imaging, there was a small area of late gadolinium enhancement (LGE) at the true apex. Difficult to tell if subendocardial versus mid-Falletta.   MEASUREMENTS: MEASUREMENTS LVEDV 169 mL   LV SV 103   LV EF 61%   IMPRESSION: 1.  Normal LV size and systolic function, EF 61%.   2. Pattern of asymmetric hypertrophy involving the LV apex. Not marked, but clear difference from mid and basal Cato segments.   3. Small area of LGE noted at the apex, hard to tell if subendocardial or mid-Allor.   4. Overall, suggestive of apical variant hypertrophic cardiomyopathy. Given totality of findings, think less likely prior apical MI.   Dalton Mclean     Electronically Signed   By: Marca Ancona M.D.   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 There is a small area gadolinium enhancement which is late at the apex which could be either sub-endocardial or myocardial, but this is not thought to be an ischemic scar. It could be  due to some basal cardial necrosis if the muscle has become very thick. We talked about the fact that apical variant hypertrophic cardiomyopathy rarely leads to significant arrhythmias, sudden death or outflow tract obstruction. In the be reasonable for him to have ongoing monitoring. He should continue his current blood pressure medications which are also helpful as they may provide some benefit in decreasing the risk of worsening cardiomyopathy.  12/27/2016  Mr. Marucci was seen today in follow-up. He has done exceedingly well. I commended him over significant weight loss. He's had 55 pound weight loss over the past year. This is been significantly impact  fullness health. His blood pressure had become fairly low with low pulse rate and his beta blocker was discontinued. He remains on low-dose benazepril. He denies any tachycardia palpitations, chest pain or worsening shortness of breath with exertion. His last echo was in 2016 and cardiac MRI in 2017.  03/15/2018  Mr. Viloria was seen today in follow-up.  He continues to do well.  He had significant weight loss last year unfortunately he is gained the weight back.  He is now 240 pounds.  He denies any chest pain or worsening shortness of breath.  He is on low-dose benazepril and blood pressure is well controlled.  He has no palpitations.  Cardiac MRI was in 2017.  This confirmed the diagnosis of apical variant hypertrophic cardiomyopathy.  Labs from April 2018 showed total cholesterol 169, HDL 46, LDL 100 and triglycerides 161.  12/16/2020  Mr. Nyce returns today for follow-up. He seems to be doing well. Unfortunately weight is gone back up now to 274 pounds. At one point he was down to 240 in 2019. He does have sleep apnea is but has been noncompliant recently with his equipment. His sleep study was over 5 years ago he may need a repeat if he is interested. He does have a history of apical variant hypertrophic cardiomyopathy based on MRI in 2017. 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 This was in October 2021 and he had another episode last November. The second episode lasted for about 10 minutes and improved with vagal maneuvers the initial episode was about 2 hours.   PMHx:  Past Medical History:  Diagnosis Date   Arthritis    "knees and ankles" (04/18/2014)   Asthma    Cardiomyopathy (HCC)    Chronic kidney disease    stage 3 renal failure under control   Gout    Hypertension    SVT (supraventricular tachycardia) (HCC)     Past Surgical History:  Procedure Laterality Date   ASPIRATION BIOPSY Right 04/17/2014   ankle; "to assess swelling; they  weren't able to aspirate anything though"   COLONOSCOPY     KNEE ARTHROSCOPY Left 04/17/2014   Procedure: ARTHROSCOPY KNEE with medial menisectomy, chondralplasty of patella-femoral joint, extensive synovectomy, aspiration of right ankle;  Surgeon: Harvie Junior, MD;  Location: [facility] OR;  Service: Orthopedics;  Laterality: Left;   KNEE ARTHROSCOPY WITH MEDIAL MENISECTOMY Left 04/17/2014   chondralplasty of patella-femoral joint, extensive synovectomy   TOOTH EXTRACTION     "just 2 teeth"    FAMHx:  Family History  Problem Relation Age of Onset   Asthma Mother    Heart disease Father    Sleep apnea Father    Prostate cancer Father    Healthy Brother    Drug abuse Brother    Healthy Brother     SOCHx:  reports that he has never smoked. He has never used smokeless tobacco. He reports that he does not drink alcohol and does not use drugs.  ALLERGIES:  Allergies  Allergen Reactions   Penicillins Other (See Comments)    Had med as a child doesn't know the reaction    ROS: Pertinent items noted in HPI and remainder of comprehensive ROS otherwise negative.  HOME MEDS: Current Outpatient Medications  Medication Sig Dispense Refill   allopurinol (ZYLOPRIM) 100 MG tablet TAKE 3 TABLETS (300MG  TOTAL) BY MOUTH EVERY DAY 90 tablet 0   benazepril (LOTENSIN) 10 MG tablet Take 10 mg by mouth daily.     Fluticasone-Salmeterol (ADVAIR) 100-50 MCG/DOSE AEPB Inhale 1 puff into the lungs as needed (SOB).      metoprolol tartrate (LOPRESSOR) 25 MG tablet Take 1 tablet (25 mg total) by mouth 2 (two) times daily as needed. 30 tablet 0   montelukast (SINGULAIR) 10 MG tablet Take 10 mg by mouth at bedtime.     benzonatate (TESSALON) 100 MG capsule Take 1 capsule (100 mg total) by mouth every 8 (eight) hours. (Patient not taking: Reported on 10/10/2023) 21 capsule 0   loperamide (IMODIUM) 2 MG capsule Take 1 capsule (2 mg total) by mouth 2 (two) times daily as needed for diarrhea or loose stools.  (Patient not taking: Reported on 10/10/2023) 14 capsule 0   naproxen (NAPROSYN) 500 MG tablet Take 1 tablet (500 mg total) by mouth 2 (two) times daily. (Patient not taking: Reported on 10/10/2023) 30 tablet 0   ondansetron (ZOFRAN-ODT) 4 MG disintegrating tablet Take 1 tablet (4 mg total) by mouth every 8 (eight) hours as needed for nausea or vomiting. (Patient not taking: Reported on 10/10/2023) 20 tablet 0   promethazine-dextromethorphan (PROMETHAZINE-DM) 6.25-15 MG/5ML syrup Take 2.5 mLs by mouth at bedtime as needed for cough. (Patient not taking: Reported on 10/10/2023) 118 mL 0   No current facility-administered medications for this visit.    LABS/IMAGING: No results found for this or any previous visit (from the past 48 hour(s)). No results found.   WEIGHTS: Wt Readings from Last 3 Encounters:  10/10/23 272 lb (123.4 kg)  06/03/23 273 lb 9.6 oz (124.1 kg)  12/16/20 274 lb (124.3 kg)    VITALS: BP 122/80 (BP Location: Left Arm, Patient Position: Sitting, Cuff Size: Normal)   Pulse 82   Ht 6\' 1"  (1.854 m)   Wt 272 lb (123.4 kg)   SpO2 96%   BMI 35.89 kg/m   EXAM: General appearance: alert and no distress Neck: no carotid bruit and no JVD Lungs: clear to auscultation bilaterally Heart: regular rate and rhythm, S1, S2 normal, no murmur, click, rub or gallop Abdomen: soft, non-tender; bowel sounds normal; no masses,  no organomegaly Extremities: extremities normal, atraumatic, no cyanosis or edema Pulses: 2+ and symmetric Skin: Skin color, texture, turgor normal. 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   Mr. 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 He had a shorter duration episode after that. These episodes are likely to recur and if it is not improved with  vagal maneuvers, I will provide him with a low-dose of as needed metoprolol to use for that. He has been noncompliant with CPAP and I think that could increase his risk of having these episodes. I encouraged him to use his equipment. Blood pressure is reasonable today. He denies any chest pain. EKG shows stable lateral T wave inversions consistent with known apical variant hypertrophic cardiomyopathy. Plan follow-up with me annually or sooner as necessary.  Chrystie Nose, MD, [facility], FACP  [place]  [facility]  Medical Director of the [facility]  Cardiovascular Risk Reduction Clinic Diplomate of the American Board of Clinical Lipidology Attending Cardiologist  Direct Dial: [phone]  Fax: [phone]  Website:  [email or web address] Nicely Read Bonelli 10/10/2023, 4:19 PM

## 2023-10-10 NOTE — Patient Instructions (Signed)
Medication Instructions:  No changes. *If you need a refill on your cardiac medications before your next appointment, please call your pharmacy*   Follow-Up: At [facility], you and your health needs are our priority.  As part of our continuing mission to provide you with exceptional heart care, we have created designated Provider Care Teams.  These Care Teams include your primary Cardiologist (physician) and Advanced Practice Providers (APPs -  Physician Assistants and Nurse Practitioners) who all work together to provide you with the care you need, when you need it.  We recommend signing up for the patient portal called "MyChart".  Sign up information is provided on this After Visit Summary.  MyChart is used to connect with patients for Virtual Visits (Telemedicine).  Patients are able to view lab/test results, encounter notes, upcoming appointments, etc.  Non-urgent messages can be sent to your provider as well.   To learn more about what you can do with MyChart, go to [email or web address].    Your next appointment:   12 month(s)  Provider:   Chrystie Nose, MD

## 2024-03-30 LAB — LAB REPORT - SCANNED
A1c: 5.9
Creatinine, POC: 235 mg/dL
EGFR: 61
Microalb Creat Ratio: 9.4
Microalbumin, Urine: 2.21
